# Patient Record
Sex: Female | Born: 1960 | Race: White | Hispanic: No | Marital: Married | Smoking: Never smoker
Health system: Southern US, Community
[De-identification: ages and names within clinical notes are randomized; demographics above are authoritative.]

## PROBLEM LIST (undated history)

## (undated) DIAGNOSIS — I8390 Asymptomatic varicose veins of unspecified lower extremity: Secondary | ICD-10-CM

## (undated) DIAGNOSIS — M722 Plantar fascial fibromatosis: Secondary | ICD-10-CM

## (undated) DIAGNOSIS — K449 Diaphragmatic hernia without obstruction or gangrene: Secondary | ICD-10-CM

## (undated) DIAGNOSIS — E785 Hyperlipidemia, unspecified: Secondary | ICD-10-CM

## (undated) DIAGNOSIS — G4700 Insomnia, unspecified: Secondary | ICD-10-CM

## (undated) DIAGNOSIS — I10 Essential (primary) hypertension: Secondary | ICD-10-CM

## (undated) DIAGNOSIS — K5790 Diverticulosis of intestine, part unspecified, without perforation or abscess without bleeding: Secondary | ICD-10-CM

## (undated) DIAGNOSIS — E079 Disorder of thyroid, unspecified: Secondary | ICD-10-CM

## (undated) DIAGNOSIS — K219 Gastro-esophageal reflux disease without esophagitis: Secondary | ICD-10-CM

## (undated) DIAGNOSIS — K37 Unspecified appendicitis: Secondary | ICD-10-CM

## (undated) DIAGNOSIS — T7840XA Allergy, unspecified, initial encounter: Secondary | ICD-10-CM

## (undated) HISTORY — DX: Diaphragmatic hernia without obstruction or gangrene: K44.9

## (undated) HISTORY — DX: Essential (primary) hypertension: I10

## (undated) HISTORY — DX: Disorder of thyroid, unspecified: E07.9

## (undated) HISTORY — PX: COLONOSCOPY: SHX174

## (undated) HISTORY — DX: Asymptomatic varicose veins of unspecified lower extremity: I83.90

## (undated) HISTORY — DX: Allergy, unspecified, initial encounter: T78.40XA

## (undated) HISTORY — DX: Hyperlipidemia, unspecified: E78.5

## (undated) HISTORY — DX: Unspecified appendicitis: K37

## (undated) HISTORY — PX: LASER ABLATION: SHX1947

## (undated) HISTORY — DX: Diverticulosis of intestine, part unspecified, without perforation or abscess without bleeding: K57.90

## (undated) HISTORY — DX: Plantar fascial fibromatosis: M72.2

## (undated) HISTORY — DX: Gastro-esophageal reflux disease without esophagitis: K21.9

---

## 1998-01-20 ENCOUNTER — Ambulatory Visit (HOSPITAL_COMMUNITY): Admission: RE | Admit: 1998-01-20 | Discharge: 1998-01-20 | Payer: Self-pay | Admitting: Obstetrics and Gynecology

## 1998-12-31 ENCOUNTER — Other Ambulatory Visit: Admission: RE | Admit: 1998-12-31 | Discharge: 1998-12-31 | Payer: Self-pay | Admitting: Obstetrics and Gynecology

## 2000-01-03 ENCOUNTER — Other Ambulatory Visit: Admission: RE | Admit: 2000-01-03 | Discharge: 2000-01-03 | Payer: Self-pay | Admitting: Obstetrics and Gynecology

## 2001-01-30 ENCOUNTER — Other Ambulatory Visit: Admission: RE | Admit: 2001-01-30 | Discharge: 2001-01-30 | Payer: Self-pay | Admitting: Obstetrics and Gynecology

## 2002-02-04 ENCOUNTER — Other Ambulatory Visit: Admission: RE | Admit: 2002-02-04 | Discharge: 2002-02-04 | Payer: Self-pay | Admitting: Obstetrics and Gynecology

## 2003-02-18 ENCOUNTER — Other Ambulatory Visit: Admission: RE | Admit: 2003-02-18 | Discharge: 2003-02-18 | Payer: Self-pay | Admitting: Obstetrics and Gynecology

## 2004-02-26 ENCOUNTER — Other Ambulatory Visit: Admission: RE | Admit: 2004-02-26 | Discharge: 2004-02-26 | Payer: Self-pay | Admitting: Obstetrics and Gynecology

## 2005-03-29 ENCOUNTER — Other Ambulatory Visit: Admission: RE | Admit: 2005-03-29 | Discharge: 2005-03-29 | Payer: Self-pay | Admitting: Obstetrics and Gynecology

## 2006-04-22 ENCOUNTER — Emergency Department (HOSPITAL_COMMUNITY): Admission: EM | Admit: 2006-04-22 | Discharge: 2006-04-22 | Payer: Self-pay | Admitting: Emergency Medicine

## 2009-01-07 ENCOUNTER — Ambulatory Visit: Payer: Self-pay | Admitting: Vascular Surgery

## 2009-05-20 ENCOUNTER — Ambulatory Visit: Payer: Self-pay | Admitting: Vascular Surgery

## 2009-07-08 ENCOUNTER — Ambulatory Visit: Payer: Self-pay | Admitting: Vascular Surgery

## 2009-07-17 ENCOUNTER — Ambulatory Visit: Payer: Self-pay | Admitting: Vascular Surgery

## 2010-05-19 ENCOUNTER — Ambulatory Visit
Admission: RE | Admit: 2010-05-19 | Discharge: 2010-05-19 | Payer: Self-pay | Source: Home / Self Care | Attending: Vascular Surgery | Admitting: Vascular Surgery

## 2010-05-19 ENCOUNTER — Ambulatory Visit: Admit: 2010-05-19 | Payer: Self-pay | Admitting: Vascular Surgery

## 2010-07-14 ENCOUNTER — Other Ambulatory Visit (INDEPENDENT_AMBULATORY_CARE_PROVIDER_SITE_OTHER): Payer: BC Managed Care – PPO | Admitting: Vascular Surgery

## 2010-07-14 DIAGNOSIS — I83893 Varicose veins of bilateral lower extremities with other complications: Secondary | ICD-10-CM

## 2010-07-15 NOTE — Assessment & Plan Note (Signed)
OFFICE VISIT  CYNTIA, STALEY DOB:  Sep 09, 1960                                       07/14/2010 ZOXWR#:[id number]  The patient presents today for treatment of her left leg venous hypertension.  She had ablation of her left greater saphenous vein from just below her knee to just above the saphenofemoral junction and stab phlebectomy of tributaries in her left medial calf.  She had no immediate complication and was discharged to home.  She will be seen again in 1 week for continued followup.    Larina Earthly, M.D. Electronically Signed  TFE/MEDQ  D:  07/14/2010  T:  07/15/2010  Job:  [id number]

## 2010-07-21 ENCOUNTER — Ambulatory Visit (INDEPENDENT_AMBULATORY_CARE_PROVIDER_SITE_OTHER): Payer: BC Managed Care – PPO | Admitting: Vascular Surgery

## 2010-07-21 ENCOUNTER — Encounter (INDEPENDENT_AMBULATORY_CARE_PROVIDER_SITE_OTHER): Payer: BC Managed Care – PPO

## 2010-07-21 DIAGNOSIS — Z48812 Encounter for surgical aftercare following surgery on the circulatory system: Secondary | ICD-10-CM

## 2010-07-21 DIAGNOSIS — I83893 Varicose veins of bilateral lower extremities with other complications: Secondary | ICD-10-CM

## 2010-07-22 NOTE — Assessment & Plan Note (Signed)
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  She did extremely well following the procedure with minimal discomfort.  She does have some mild bruising.  She underwent a repeat duplex today, and this shows closure of her great saphenous vein to just below her saphenofemoral junction and no evidence of DVT.  I am quite pleased with her result, as is the patient.  She will see [place] again on an as-needed basis.    Larina Earthly, M.D. Electronically Signed  TFE/MEDQ  D:  07/21/2010  T:  07/22/2010  Job:  [id number]  cc:   Caryn Bee L. Little, M.D.

## 2010-07-27 NOTE — Procedures (Unsigned)
DUPLEX DEEP VENOUS EXAM - LOWER EXTREMITY  INDICATION:  Status post EVLT.  HISTORY:  Edema:  No. Trauma/Surgery:  Status post EVLT on 07/14/10. Pain:  No. PE:  No. Previous DVT:  No. Anticoagulants:  No. Other:  DUPLEX EXAM:               CFV   SFV   PopV  PTV    GSV               R  L  R  L  R  L  R   L  R  L Thrombosis    o  o     o     o      o     + Spontaneous   +  +     +     +      +     o Phasic        +  +     +     +      +     o Augmentation  +  +     +     +      +     o Compressible  +  +     +     +      +     o Competent     +  +     +     +      +     +  Legend:  + - yes  o - no  p - partial  D - decreased  IMPRESSION: 1. No evidence of acute deep venous thrombosis within the left lower     extremity. 2. Successful left greater saphenous vein ablation from the     saphenofemoral junction to the distal insertion site.   _____________________________ Larina Earthly, M.D.  OD/MEDQ  D:  07/21/2010  T:  07/21/2010  Job:  [id number]

## 2010-09-21 NOTE — Assessment & Plan Note (Signed)
OFFICE VISIT   Anna Mills, Anna Mills  DOB:  December 31, 1960                                       05/19/2010  EAVWU#:[id number]   The patient presents today for evaluation of left leg venous  hypertension and pain.  She is well-known to me from a prior right leg  great saphenous vein laser ablation and stab phlebectomy on July 08, 2009.  She had had symptoms in her left leg at the same setting but  these were mild.  She has subsequently had worsening discomfort in her  left leg.  She has had complete resolution of all of prior pain in her  right leg and reports that she is walking and exercising more due to the  relief of her right leg.  Her left leg has now become more of a burden.  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  She does not have any history of DVT.   On physical exam, her right leg has no evidence of varicosities.  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   On duplex imaging, she does have reflux throughout her left great  saphenous vein with a dilatation up to a maximal diameter of 0.79 cm.  She does have a prolonged reflux in the great saphenous vein.  She does  not have any significant reflux in the deep system.  There is some  reflux in the common femoral vein only.  I discussed options with the  patient.  She has been wearing her thigh-high 20 to 30 mmHg graduated  compression stockings for greater than 3 months on the left leg and is  not having any appreciable relief of symptoms.  She is an ideal  candidate for ablation, and I would expect the same outstanding result  in the left leg is as she has achieved in her right leg.  We will  schedule this at her earliest convenience.     Larina Earthly, M.D.  Electronically Signed   TFE/MEDQ  D:  05/19/2010  T:  05/20/2010  Job:  [id number]

## 2010-09-21 NOTE — Assessment & Plan Note (Signed)
OFFICE VISIT   Anna Mills, Anna Mills  DOB:  06/11/1960                                       05/19/2009  ZOXWR#:[id number]   The patient presents today for continued evaluation of her right leg  venous varicosities.  She has worn graduated compression garments since  our last visit with her.  She reports that this does not give her any  improvement.  She does report that she continues to have pain with  prolonged standing most pronounced over her varicosities in her  popliteal fossa but also she reports an achy sensation further in her  calf as well.  She works as a Engineer, civil (consulting) and has leg pain making performing  her job duties difficult with prolonged standing.  She also reports that  exercising is more difficult due to leg pain and has had to decrease  frequency and duration of her aerobic exercise.   REVIEW OF SYSTEMS:  Otherwise unchanged.  No major medical difficulties.  Otherwise healthy.  Does not smoke or drink alcohol.   PHYSICAL EXAM:  Vital signs:  Blood pressure is 107/70, pulse 61,  respirations 16.  General:  She is in no acute distress, well-nourished.  Her dorsalis pedis pulses are 2+ bilaterally.  She does not have any  major deformities in her lower extremities.  No cyanosis.  She has no  focal neurologic deficits.  She has no ulceration or rashes.  She does  have marked saphenous tributary varicosities in her medial calf and  popliteal fossa.   I again reviewed her duplex with her from September of 2010.  This does  show reflux in her right great saphenous vein from her groin to her calf  with the varicosities coming off the refluxing vein.  I explained that  this is not putting her at any increased risk for DVT but she is having  pain associated with this.  I have suggested that we proceed with laser  ablation of her right great saphenous vein, stab phlebectomy of  tributary varicosities for relief of her symptoms.     Larina Earthly, M.D.  Electronically Signed   TFE/MEDQ  D:  05/20/2009  T:  05/21/2009  Job:  [id number]

## 2010-09-21 NOTE — Consult Note (Signed)
NEW PATIENT CONSULTATION   Anna Mills, Anna Mills  DOB:  1960/08/03                                       01/07/2009  ZOXWR#:[id number]   The patient presents today for evaluation of right leg varicosities and  pain.  She is a very active pleasant 50 year old white female with  progressive and severe discomfort in her right popliteal space related  to venous varicosities.  She reports this has been present for a number  of years but has been progressive.  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  She does not have any history  of DVT.   PAST MEDICAL HISTORY:  Significant for hypothyroidism.  She otherwise  has no history of cardiac disease or pulmonary dysfunction.   SOCIAL HISTORY:  She is married with three children.  She does not smoke  or drink alcohol.  Weight is 150 pounds.  She is 5 feet 7 inches tall.  She denies cardiac, pulmonary, GI or GU difficulty.  She does have sinus  headaches.   MEDICATION ALLERGIES:  Valtrex and codeine.   CURRENT MEDICATIONS:  Synthroid and lisinopril.   PHYSICAL EXAMINATION:  A well-developed, well-nourished white female  appearing stated age of 47.  Blood pressure is 109/76, pulse 65,  respirations 18.  Her radial and dorsalis pedis pulses are 2+  bilaterally.  She does not have any significant varicosities in her left  leg.  On her right leg she does have marked varicosities in her medial  popliteal space.   She underwent venous duplex in our office and this revealed reflux  throughout her right great saphenous vein with a connection between this  and the large varicosities in her popliteal space.  This does not appear  to involve her small saphenous vein.  She has mild deep venous reflux.   I discussed the significance of this with the patient.  She does elevate  her legs when possible and takes Advil for discomfort.   She is fitted  today for graduated compression stockings and we have instructed her on  the use of these.  I plan to see her again in 3 months to determine if  this is working for her.  I did discuss the option of laser ablation and  stab phlebectomy for relief of symptoms should she fail conservative  treatment.  We will see her again in 3 months for continued discussion.   Larina Earthly, M.D.  Electronically Signed   TFE/MEDQ  D:  01/07/2009  T:  01/07/2009  Job:  [id number]   cc:   Caryn Bee L. Little, M.D.

## 2010-09-21 NOTE — Procedures (Signed)
LOWER EXTREMITY VENOUS REFLUX EXAM   INDICATION:  Right lower extremity varicose veins in posterior calf.   EXAM:  Using color-flow imaging and pulse Doppler spectral analysis, the  right common femoral, superficial femoral, popliteal, posterior tibial,  greater and lesser saphenous veins are evaluated.  There is evidence  suggesting mild deep venous insufficiency in the right lower extremity.   The right saphenofemoral junction is mildly incompetent with reflux of  >500 milliseconds.  The right GSV is not competent  with reflux of >500  milliseconds, with the caliber as described below.   The right proximal short saphenous vein was not visualized.   GSV Diameter (used if found to be incompetent only)                                            Right    Left  Proximal Greater Saphenous Vein           0.55 cm  cm  Proximal-to-mid-thigh                     cm       cm  Mid thigh                                 0.43 cm  cm  Mid-distal thigh                          cm       cm  Distal thigh                              0.42 cm  cm  Knee                                      0.38 cm  cm   IMPRESSION:  1. Right greater saphenous vein reflux with >500 milliseconds is      identified with the caliber ranging from 0.38 cm to 0.55 cm knee to      groin.  2. The right greater saphenous vein is not aneurysmal.  3. The right greater saphenous vein is not tortuous.  4. The deep venous system is mildly incompetent with reflux of >500      milliseconds.  5. The right lesser saphenous vein was not visualized.         ___________________________________________  Larina Earthly, M.D.   AS/MEDQ  D:  01/07/2009  T:  01/07/2009  Job:  [id number]

## 2010-09-21 NOTE — Assessment & Plan Note (Signed)
OFFICE VISIT   AUGUST, GOSSER  DOB:  [birth year]                                       07/17/2009  ZOXWR#:[id number]   Patient presents today for evaluation of her right leg greater saphenous  vein laser ablation and stab phlebectomy and multiple tributary  varicosities.  The surgery was on 07/08/09.  She has done well since the  procedure.  She does have the usual amount of bruising.  She has some  thickening at the area of the subcuticular phlebectomy and minimal  discomfort with this.   She underwent repeat venous duplex today in our office, and this  confirms closure of her great saphenous vein and injury and no DVT in  her deep system.   I am quite pleased with her initial result as is the patient.  I plan to  see her again in 6 weeks for continued follow-up.     Larina Earthly, M.D.  Electronically Signed   TFE/MEDQ  D:  07/17/2009  T:  07/17/2009  Job:  [id number]   cc:   Caryn Bee L. Little, M.D.

## 2010-09-21 NOTE — Procedures (Signed)
LOWER EXTREMITY VENOUS REFLUX EXAM   INDICATION:  Varicose veins.   EXAM:  Using color-flow imaging and pulse Doppler spectral analysis, the  left common femoral, superficial femoral, popliteal, posterior tibial,  greater and lesser saphenous veins are evaluated.  There is evidence  suggesting deep venous insufficiency in the left common femoral vein.   The left saphenofemoral junction is not competent with reflux of  >539milliseconds. The left GSV is not competent with reflux of >500  milliseconds with the caliber as described below.   The left proximal short saphenous vein demonstrates competency.   GSV Diameter (used if found to be incompetent only)                                            Right    Left  Proximal Greater Saphenous Vein           cm       0.78 cm  Proximal-to-mid-thigh                     cm       0.79 cm  Mid thigh                                 cm       0.51 cm  Mid-distal thigh                          cm       cm  Distal thigh                              cm       0.75 cm  Knee                                      cm       cm   IMPRESSION:  1. Left greater saphenous vein is not competent with reflux      >571milliseconds.  2. The left greater saphenous vein is not tortuous.  3. The deep venous system is not competent with reflux of      >563milliseconds in the left common femoral vein.  4. The left short saphenous vein is not competent.   ___________________________________________  Larina Earthly, M.D.   OD/MEDQ  D:  05/19/2010  T:  05/19/2010  Job:  [id number]

## 2010-09-21 NOTE — Assessment & Plan Note (Signed)
OFFICE VISIT   AVELEEN, NEVERS  DOB:  Dec 30, 1960                                       07/08/2009  ZOXWR#:[id number]   The patient presents today for treatment of her right leg venous  hypertension.  She underwent right great saphenous vein laser ablation  from her knee to just below her saphenofemoral junction and also  multiple stab phlebectomies of painful tributary varicosities in her  calf.  This was done under tumescent local anesthesia.  She had no  immediate complications and was discharged without difficulty.   Plan to see her again in 1 week with venous duplex ultrasound follow-up.     Larina Earthly, M.D.  Electronically Signed   TFE/MEDQ  D:  07/08/2009  T:  07/09/2009  Job:  [id number]

## 2010-09-21 NOTE — Procedures (Signed)
DUPLEX DEEP VENOUS EXAM - LOWER EXTREMITY   INDICATION:  Status post right greater saphenous vein laser ablation.   HISTORY:  Edema:  No.  Trauma/Surgery:  Right greater saphenous vein laser ablation on  07/08/09.  Pain:  No.  PE:  No.  Previous DVT:  No.  Anticoagulants:  Other:   DUPLEX EXAM:                CFV   SFV   PopV  PTV    GSV                R  L  R  L  R  L  R   L  R  L  Thrombosis    o  o  o     o     o      +  Spontaneous   +  +  +     +     +      o  Phasic        +  +  +     +     +      o  Augmentation  +  +  +     +     +      o  Compressible  +  +  +     +     +      o  Competent     +  +  +     +     +      o   Legend:  + - yes  o - no  p - partial  D - decreased   IMPRESSION:  1. No evidence of deep venous thrombosis noted in the right lower      extremity.  2. A totally occluded right greater saphenous vein noted extending      from the distal insertion site to the groin area.  3. The right saphenofemoral junction is competent.     _____________________________  Larina Earthly, M.D.   CH/MEDQ  D:  07/20/2009  T:  07/20/2009  Job:  [id number]

## 2013-11-19 ENCOUNTER — Other Ambulatory Visit: Payer: Self-pay | Admitting: Obstetrics and Gynecology

## 2013-11-19 DIAGNOSIS — R928 Other abnormal and inconclusive findings on diagnostic imaging of breast: Secondary | ICD-10-CM

## 2013-12-06 ENCOUNTER — Ambulatory Visit
Admission: RE | Admit: 2013-12-06 | Discharge: 2013-12-06 | Disposition: A | Discharge status: Home / Self Care | Payer: BC Managed Care – PPO | Source: Ambulatory Visit | Attending: Obstetrics and Gynecology | Admitting: Obstetrics and Gynecology

## 2013-12-06 DIAGNOSIS — R928 Other abnormal and inconclusive findings on diagnostic imaging of breast: Secondary | ICD-10-CM

## 2013-12-12 ENCOUNTER — Other Ambulatory Visit: Payer: Self-pay | Admitting: Obstetrics and Gynecology

## 2013-12-12 DIAGNOSIS — R9230 Dense breasts, unspecified: Secondary | ICD-10-CM

## 2013-12-12 DIAGNOSIS — Z803 Family history of malignant neoplasm of breast: Secondary | ICD-10-CM

## 2013-12-12 DIAGNOSIS — R922 Inconclusive mammogram: Secondary | ICD-10-CM

## 2013-12-18 ENCOUNTER — Ambulatory Visit
Admission: RE | Admit: 2013-12-18 | Discharge: 2013-12-18 | Disposition: A | Discharge status: Home / Self Care | Payer: BC Managed Care – PPO | Source: Ambulatory Visit | Attending: Obstetrics and Gynecology | Admitting: Obstetrics and Gynecology

## 2013-12-18 DIAGNOSIS — Z803 Family history of malignant neoplasm of breast: Secondary | ICD-10-CM

## 2013-12-18 DIAGNOSIS — R922 Inconclusive mammogram: Secondary | ICD-10-CM

## 2013-12-18 MED ORDER — GADOBENATE DIMEGLUMINE 529 MG/ML IV SOLN
15.0000 mL | Freq: Once | INTRAVENOUS | Status: AC | PRN
  Administered 2013-12-18: 15 mL via INTRAVENOUS

## 2014-10-08 ENCOUNTER — Telehealth: Payer: Self-pay | Admitting: *Deleted

## 2014-10-08 NOTE — Telephone Encounter (Signed)
Left detailed telephone message returning Anna Mills's earlier call regarding "creepy,shaking" feeling in her legs in absence of varicose veins.    Anna Mills is s/p 07-08-2009 endovenous laser ablation right greater saphenous vein and stab phlebectomy (right leg) by Curt Jews MD and s/p 07-14-2010 endovenous laser ablation left greater saphenous vein and stab phlebectomy by Curt Jews MD.  Explained that leg heaviness, achiness, burning sensation are symptomatic of venous issues. Explained that the "creepy, shaking " feeling in her legs is not normally symptomatic of venous issues. If she has further questions or concerns about her leg symptoms, I asked that she call me back.  Left specific times when I would be available to talk tomorrow (10-09-2014) if needed.

## 2014-12-16 ENCOUNTER — Other Ambulatory Visit: Payer: Self-pay | Admitting: *Deleted

## 2014-12-16 DIAGNOSIS — I83893 Varicose veins of bilateral lower extremities with other complications: Secondary | ICD-10-CM

## 2014-12-16 DIAGNOSIS — R200 Anesthesia of skin: Secondary | ICD-10-CM

## 2015-01-09 ENCOUNTER — Encounter: Payer: Self-pay | Admitting: Vascular Surgery

## 2015-01-13 ENCOUNTER — Encounter: Payer: Self-pay | Admitting: Vascular Surgery

## 2015-01-13 ENCOUNTER — Ambulatory Visit (INDEPENDENT_AMBULATORY_CARE_PROVIDER_SITE_OTHER): Payer: 59 | Admitting: Vascular Surgery

## 2015-01-13 ENCOUNTER — Encounter (HOSPITAL_COMMUNITY): Payer: Self-pay

## 2015-01-13 ENCOUNTER — Ambulatory Visit: Payer: Self-pay | Admitting: Vascular Surgery

## 2015-01-13 ENCOUNTER — Ambulatory Visit (HOSPITAL_COMMUNITY)
Admission: RE | Admit: 2015-01-13 | Discharge: 2015-01-13 | Disposition: A | Discharge status: Home / Self Care | Payer: 59 | Source: Ambulatory Visit | Attending: Vascular Surgery | Admitting: Vascular Surgery

## 2015-01-13 VITALS — BP 135/65 | HR 66 | Temp 98.2°F | Resp 16 | Ht 67.5 in | Wt 159.0 lb

## 2015-01-13 DIAGNOSIS — R200 Anesthesia of skin: Secondary | ICD-10-CM

## 2015-01-13 DIAGNOSIS — R208 Other disturbances of skin sensation: Secondary | ICD-10-CM

## 2015-01-13 NOTE — Progress Notes (Signed)
Patient name: Anna Mills MRN: [id number] DOB: 22-Jan-1961 Sex: female    Reason for referral:  Chief Complaint  Patient presents with  . Varicose Veins    numbness and tingling both legs and coldness left leg    HISTORY OF PRESENT ILLNESS: Patient presents today for evaluation of lower extremity symptoms. She is well-known to me from prior staged bilateral great saphenous vein ablation and stab phlebectomy of multiple large Tributary varicosities in her right leg. This was in 2011 and 2012. She has done well since that time. 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 She reports that she has had noted some slowly progressive sensation of numbness from her knees distally. This is worse in the left but can occur on the right as well. This does seem to be associated with prolonged standing. She has no history of DVT. She has no recurrent varicosities and no history of arterial insufficiency. She does have some mild chronic back discomfort but no diagnosis of degenerative disc disease. She does wear compression garments intermittently.  Past Medical History  Diagnosis Date  . Varicose veins   . Hypertension   . Thyroid disease     Past Surgical History  Procedure Laterality Date  . Laser ablation Bilateral     Social History   Social History  . Marital Status: Single    Spouse Name: N/A  . Number of Children: N/A  . Years of Education: N/A   Occupational History  . Not on file.   Social History Main Topics  . Smoking status: Never Smoker   . Smokeless tobacco: Never Used  . Alcohol Use: No  . Drug Use: No  . Sexual Activity: Not on file   Other Topics Concern  . Not on file   Social History Narrative  . No narrative on file    Family History  Problem Relation Age of Onset  . Cancer Mother     breast  . Peripheral vascular disease Father     Allergies as of 01/13/2015 - Review Complete 01/13/2015  Allergen Reaction Noted    . Valcyte [valganciclovir hcl] Swelling 01/13/2015    No current outpatient prescriptions on file prior to visit.   No current facility-administered medications on file prior to visit.     REVIEW OF SYSTEMS:  Positives indicated with an "X"  CARDIOVASCULAR:  [ ]  chest pain   [ ]  chest pressure   [ ]  palpitations   [ ]  orthopnea   [ ]  dyspnea on exertion   [ ]  claudication   [ ]  rest pain   [ ]  DVT   [ ]  phlebitis PULMONARY:   [ ]  productive cough   [ ]  asthma   [ ]  wheezing NEUROLOGIC:   [ ]  weakness  [ ]  paresthesias  [ ]  aphasia  [ ]  amaurosis  [ ]  dizziness HEMATOLOGIC:   [ ]  bleeding problems   [ ]  clotting disorders MUSCULOSKELETAL:  [ ]  joint pain   [ ]  joint swelling GASTROINTESTINAL: [ ]   blood in stool  [ ]   hematemesis GENITOURINARY:  [ ]   dysuria  [ ]   hematuria PSYCHIATRIC:  [ ]  history of major depression INTEGUMENTARY:  [ ]  rashes  [ ]  ulcers CONSTITUTIONAL:  [ ]  fever   [ ]  chills  PHYSICAL EXAMINATION:  General: The patient is a well-nourished female, in no acute distress. Vital signs are BP 135/65 mmHg  Pulse 66  Temp(Src) 98.2 F (36.8 C)  Resp 16  Ht 5' 7.5" (1.715 m)  Wt 159 lb (72.122 kg)  BMI 24.52 kg/m2  SpO2 100% Pulmonary: There is a good air exchange  Musculoskeletal: There are no major deformities.  There is no significant extremity pain. Neurologic: No focal weakness or paresthesias are detected, Skin: There are no ulcer or rashes noted. Psychiatric: The patient has normal affect. Cardiovascular: Palpable dorsalis pedis pulses bilaterally Few scattered telangiectasia bilaterally. No varicosities and no chronic edema changes of venous hypertension   VVS Vascular Lab Studies:  Ordered and Independently Reviewed lower extremity rate venous reflux evaluation showed successful closure of her great saphenous vein bilaterally. She has no incompetence of her small saphenous vein bilaterally. She does have some reflux in her popliteal veins  bilaterally  Impression and Plan:  Had long discussion with patient. I do not see any evidence of correctable venous pathology to explain her symptoms. Explained that the numbness usually is not a component of venous hypertension. I do not see any reason for her to limit activity. Would wear compression if this makes her legs feel better but not if it does not make her legs feel better. She does have mild deep venous reflux with reflux in the popliteal veins. Discussed this with. This does not prevent her from her usual activities of work and exercise. She was reassured with this discussion will see [place] again on as-needed basis    [facility] Office: [phone]

## 2016-01-06 ENCOUNTER — Encounter: Payer: Self-pay | Admitting: Podiatry

## 2016-01-06 ENCOUNTER — Ambulatory Visit (INDEPENDENT_AMBULATORY_CARE_PROVIDER_SITE_OTHER): Payer: 59 | Admitting: Podiatry

## 2016-01-06 VITALS — BP 117/67 | HR 56

## 2016-01-06 DIAGNOSIS — M722 Plantar fascial fibromatosis: Secondary | ICD-10-CM

## 2016-01-06 NOTE — Patient Instructions (Signed)
Continue wearing your existing orthotics in athletic lace up style shoes To further support your arch use prewrap apply to midfoot and then cover with 2 inch sports tape, apply daily as needed    Plantar Fasciitis Plantar fasciitis is a painful foot condition that affects the heel. It occurs when the band of tissue that connects the toes to the heel bone (plantar fascia) becomes irritated. This can happen after exercising too much or doing other repetitive activities (overuse injury). The pain from plantar fasciitis can range from mild irritation to severe pain that makes it difficult for you to walk or move. The pain is usually worse in the morning or after you have been sitting or lying down for a while. CAUSES This condition may be caused by:  Standing for long periods of time.  Wearing shoes that do not fit.  Doing high-impact activities, including running, aerobics, and ballet.  Being overweight.  Having an abnormal way of walking (gait).  Having tight calf muscles.  Having high arches in your feet.  Starting a new athletic activity. SYMPTOMS The main symptom of this condition is heel pain. Other symptoms include:  Pain that gets worse after activity or exercise.  Pain that is worse in the morning or after resting.  Pain that goes away after you walk for a few minutes. DIAGNOSIS This condition may be diagnosed based on your signs and symptoms. Your health care provider will also do a physical exam to check for:  A tender area on the bottom of your foot.  A high arch in your foot.  Pain when you move your foot.  Difficulty moving your foot. You may also need to have imaging studies to confirm the diagnosis. These can include:  X-rays.  Ultrasound.  MRI. TREATMENT  Treatment for plantar fasciitis depends on the severity of the condition. Your treatment may include:  Rest, ice, and over-the-counter pain medicines to manage your pain.  Exercises to stretch your  calves and your plantar fascia.  A splint that holds your foot in a stretched, upward position while you sleep (night splint).  Physical therapy to relieve symptoms and prevent problems in the future.  Cortisone injections to relieve severe pain.  Extracorporeal shock wave therapy (ESWT) to stimulate damaged plantar fascia with electrical impulses. It is often used as a last resort before surgery.  Surgery, if other treatments have not worked after 12 months. HOME CARE INSTRUCTIONS  Take medicines only as directed by your health care provider.  Avoid activities that cause pain.  Roll the bottom of your foot over a bag of ice or a bottle of cold water. Do this for 20 minutes, 3-4 times a day.  Perform simple stretches as directed by your health care provider.  Try wearing athletic shoes with air-sole or gel-sole cushions or soft shoe inserts.  Wear a night splint while sleeping, if directed by your health care provider.  Keep all follow-up appointments with your health care provider. PREVENTION   Do not perform exercises or activities that cause heel pain.  Consider finding low-impact activities if you continue to have problems.  Lose weight if you need to. The best way to prevent plantar fasciitis is to avoid the activities that aggravate your plantar fascia. SEEK MEDICAL CARE IF:  Your symptoms do not go away after treatment with home care measures.  Your pain gets worse.  Your pain affects your ability to move or do your daily activities.   This information is not intended  to replace advice given to you by your health care provider. Make sure you discuss any questions you have with your health care provider.   Document Released: 01/18/2001 Document Revised: 01/14/2015 Document Reviewed: 03/05/2014 Elsevier Interactive Patient Education [employer].

## 2016-01-06 NOTE — Progress Notes (Signed)
   Subjective:    Patient ID: Anna Mills, female    DOB: 11/08/1960, 55 y.o.   MRN: [id number]  HPI    This patient presents today complaining of ongoing plantar right heel pain noticeable on FirstStep in the morning when she changed from seated to standing position. The heel pain increases with standing and walking. Over 8+ year. Patient has worn custom podiatric rigid orthotics which controls the discomfort. She currently has 2 pairs of existing rigid orthotics that are still tape used to use on a daily basis and wears them inside starting lace up athletic style shoes. Patient wants to know if she needs replacement orthotics. 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      Objective:   Physical Exam  Orientated 3  Vascular: No calf edema or calf tenderness bilaterally DP and PT pulses 2/4 bilaterally Capillary reflex immediate bilaterally  Neurological: Sensation to 10 g monofilament wire intact 5/5 bilaterally Vibratory sensation reactive bilaterally Ankle reflex equal and reactive bilaterally  Dermatological: Texture and turgor within normal limits bilaterally  Musculoskeletal: Upon weight-bearing patient has a vertical heel There is no restriction ankle, subtalar, midtarsal joints bilaterally Patient has current rigid orthotic with extrinsic rear foot post and intrinsic forefoot post that is a good state of repair and contour satisfactorily There is mild palpable tenderness in medial proximal fascial band on the right which duplicates patient areas of discomfort      Assessment & Plan:   Assessment: Chronic plantar fasciitis right Satisfactory fit of current orthotic  Plan: At this time I i patient that if her existing orthotics were adequate and I did not think she needed  replacement orthotics. I encouraged her to continue wearing orthotics in athletic style shoe. Also, I demonstrated the application of 2 inch sports tape over prewrap on her midfoot right when she returns to work to give for additional support. We also discussed shoeing and stretching  Reappoint at patient's request

## 2016-08-10 DIAGNOSIS — Z0000 Encounter for general adult medical examination without abnormal findings: Secondary | ICD-10-CM | POA: Diagnosis not present

## 2016-08-10 DIAGNOSIS — E039 Hypothyroidism, unspecified: Secondary | ICD-10-CM | POA: Diagnosis not present

## 2016-08-10 DIAGNOSIS — I10 Essential (primary) hypertension: Secondary | ICD-10-CM | POA: Diagnosis not present

## 2016-12-22 DIAGNOSIS — Z01419 Encounter for gynecological examination (general) (routine) without abnormal findings: Secondary | ICD-10-CM | POA: Diagnosis not present

## 2017-02-28 DIAGNOSIS — L570 Actinic keratosis: Secondary | ICD-10-CM | POA: Diagnosis not present

## 2017-02-28 DIAGNOSIS — D1800 Hemangioma unspecified site: Secondary | ICD-10-CM | POA: Diagnosis not present

## 2017-02-28 DIAGNOSIS — D2271 Melanocytic nevi of right lower limb, including hip: Secondary | ICD-10-CM | POA: Diagnosis not present

## 2017-04-19 DIAGNOSIS — G4721 Circadian rhythm sleep disorder, delayed sleep phase type: Secondary | ICD-10-CM | POA: Diagnosis not present

## 2017-04-24 ENCOUNTER — Ambulatory Visit: Payer: 59 | Admitting: Podiatry

## 2017-04-27 DIAGNOSIS — G4721 Circadian rhythm sleep disorder, delayed sleep phase type: Secondary | ICD-10-CM | POA: Diagnosis not present

## 2017-05-16 DIAGNOSIS — L719 Rosacea, unspecified: Secondary | ICD-10-CM | POA: Diagnosis not present

## 2017-05-16 DIAGNOSIS — L821 Other seborrheic keratosis: Secondary | ICD-10-CM | POA: Diagnosis not present

## 2017-05-16 DIAGNOSIS — Z872 Personal history of diseases of the skin and subcutaneous tissue: Secondary | ICD-10-CM | POA: Diagnosis not present

## 2017-08-15 DIAGNOSIS — E039 Hypothyroidism, unspecified: Secondary | ICD-10-CM | POA: Diagnosis not present

## 2017-08-16 DIAGNOSIS — Z1211 Encounter for screening for malignant neoplasm of colon: Secondary | ICD-10-CM | POA: Diagnosis not present

## 2017-08-16 DIAGNOSIS — Z0000 Encounter for general adult medical examination without abnormal findings: Secondary | ICD-10-CM | POA: Diagnosis not present

## 2017-08-16 DIAGNOSIS — G4700 Insomnia, unspecified: Secondary | ICD-10-CM | POA: Diagnosis not present

## 2017-10-03 ENCOUNTER — Ambulatory Visit (INDEPENDENT_AMBULATORY_CARE_PROVIDER_SITE_OTHER): Payer: 59

## 2017-10-03 ENCOUNTER — Other Ambulatory Visit: Payer: Self-pay

## 2017-10-03 ENCOUNTER — Encounter: Payer: Self-pay | Admitting: Podiatry

## 2017-10-03 ENCOUNTER — Ambulatory Visit (INDEPENDENT_AMBULATORY_CARE_PROVIDER_SITE_OTHER): Payer: 59 | Admitting: Podiatry

## 2017-10-03 VITALS — BP 139/76 | HR 65

## 2017-10-03 DIAGNOSIS — M7661 Achilles tendinitis, right leg: Secondary | ICD-10-CM

## 2017-10-03 MED ORDER — METHYLPREDNISOLONE 4 MG PO TBPK: ORAL_TABLET | ORAL | 0 refills | Status: DC

## 2017-10-03 NOTE — Progress Notes (Signed)
  Subjective:  Patient ID: Anna Mills, female    DOB: 04-Aug-1960,  MRN: [id number] HPI Chief Complaint  Patient presents with  . Foot Pain    right foot back of heel; pt stated, "been hurting for about 52mo, changed regular shoes but didnt work; have little burning and pain right now (4/10) but about 2wks ago i stopped wearing my Klogs to work and it has gotten a little better"    57 y.o. female presents with the above complaint.   ROS: Denies fever chills nausea vomiting muscle aches pains calf pain back pain chest pain shortness of breath.  Past Medical History:  Diagnosis Date  . Hypertension   . Thyroid disease   . Varicose veins    Past Surgical History:  Procedure Laterality Date  . LASER ABLATION Bilateral     Current Outpatient Medications:  .  levothyroxine (SYNTHROID, LEVOTHROID) 112 MCG tablet, Take 112 mcg by mouth daily before breakfast., Disp: , Rfl:  .  lisinopril (PRINIVIL,ZESTRIL) 10 MG tablet, , Disp: , Rfl:  .  zolpidem (AMBIEN) 10 MG tablet, , Disp: , Rfl:  .  methylPREDNISolone (MEDROL DOSEPAK) 4 MG TBPK tablet, 6 day dose pack - take as directed, Disp: 21 tablet, Rfl: 0  Allergies  Allergen Reactions  . Valcyte [Valganciclovir Hcl] Swelling   Review of Systems Objective:   Vitals:   10/03/17 1500  BP: 139/76  Pulse: 65    General: Well developed, nourished, in no acute distress, alert and oriented x3   Dermatological: Skin is warm, dry and supple bilateral. Nails x 10 are well maintained; remaining integument appears unremarkable at this time. There are no open sores, no preulcerative lesions, no rash or signs of infection present.  Vascular: Dorsalis Pedis artery and Posterior Tibial artery pedal pulses are 2/4 bilateral with immedate capillary fill time. Pedal hair growth present. No varicosities and no lower extremity edema present bilateral.   Neruologic: Grossly intact via light touch bilateral. Vibratory intact via tuning fork bilateral.  Protective threshold with Semmes Wienstein monofilament intact to all pedal sites bilateral. Patellar and Achilles deep tendon reflexes 2+ bilateral. No Babinski or clonus noted bilateral.   Musculoskeletal: No gross boney pedal deformities bilateral. No pain, crepitus, or limitation noted with foot and ankle range of motion bilateral. Muscular strength 5/5 in all groups tested bilateral.  Mild tenderness on palpation of the distal aspect of the Achilles.  There is no warmth or fluid noted significant amount of pain.  Gait: Unassisted, Nonantalgic.    Radiographs:  Radiographs taken today demonstrate thickening of the Achilles tendon on the posterior aspect of the calcaneus.  Small retrocalcaneal heel spur and Haglund's deformity.    Assessment & Plan:   Assessment: Distal Achilles tendinitis Haglund's deformity right resolving slowly.    Plan: Placed her in a night splint and put on a steroid Dosepak.  Discussed appropriate shoe gear stretching exercise ice therapy as your modifications.     Anna Mills T. [place], [place]

## 2017-10-03 NOTE — Patient Instructions (Signed)

## 2017-10-04 ENCOUNTER — Telehealth: Payer: Self-pay | Admitting: Podiatry

## 2017-10-04 NOTE — Telephone Encounter (Signed)
I saw Dr. Milinda Pointer yesterday and he wanted me to take a low dose of prednisone for six days. I just went to the drug store and they gave me 21 tablets of 4 mg of methylprednisolone and it says take as directed. I need to know what the directions are. The best number to reach me at and where you can leave a message is [phone]. Thank you.

## 2017-10-04 NOTE — Telephone Encounter (Signed)
I informed pt the instructions would either be on the front or back of the pill card, beneath the pills. Pt states she had opened the bag, but not the card so she didn't see it until just now.

## 2017-10-17 ENCOUNTER — Ambulatory Visit (INDEPENDENT_AMBULATORY_CARE_PROVIDER_SITE_OTHER): Payer: 59 | Admitting: Orthotics

## 2017-10-17 DIAGNOSIS — M722 Plantar fascial fibromatosis: Secondary | ICD-10-CM

## 2017-10-17 DIAGNOSIS — M7661 Achilles tendinitis, right leg: Secondary | ICD-10-CM

## 2017-10-17 NOTE — Progress Notes (Signed)
Patient came in today to discuss which type of shoe best helps to address achilles tendonitis.

## 2017-10-24 DIAGNOSIS — M79676 Pain in unspecified toe(s): Secondary | ICD-10-CM

## 2017-10-31 ENCOUNTER — Ambulatory Visit (INDEPENDENT_AMBULATORY_CARE_PROVIDER_SITE_OTHER): Payer: 59 | Admitting: Podiatry

## 2017-10-31 ENCOUNTER — Encounter: Payer: Self-pay | Admitting: Podiatry

## 2017-10-31 DIAGNOSIS — M722 Plantar fascial fibromatosis: Secondary | ICD-10-CM

## 2017-10-31 DIAGNOSIS — M7661 Achilles tendinitis, right leg: Secondary | ICD-10-CM

## 2017-11-01 NOTE — Progress Notes (Signed)
She presents today for follow-up of her Achilles tendinitis right plantar fasciitis left states that there is still tingling in burning.  Objective: Vital signs are stable alert and oriented x3 pulses are palpable.  She has pain on palpation of the posterior aspect of the right heel considerable nodularity but there is fluctuance overlying the nodule consistent with bursitis and Achilles tendinitis.  She also has pain on palpation medial calcaneal tubercle of the left heel.  Assessment: Achilles tendinitis bursitis right plantar fasciitis left.  Plan: Discussed etiology pathology conservative surgical therapies at this point time injected dexamethasone into the bursa today 2 mg after sterile Betadine skin prep subcutaneously making sure not to inject into the tendon itself also injected the left heel today with 20 mg Kenalog 5 mg Marcaine point maximal tenderness of the left heel.  Tolerated procedure well without complications.  I will follow-up with her in 1 month she will be scheduled with Liliane Channel for orthotics.

## 2017-11-02 ENCOUNTER — Other Ambulatory Visit: Payer: Self-pay

## 2017-11-02 ENCOUNTER — Ambulatory Visit (AMBULATORY_SURGERY_CENTER): Payer: Self-pay | Admitting: *Deleted

## 2017-11-02 VITALS — Ht 67.5 in | Wt 165.5 lb

## 2017-11-02 DIAGNOSIS — Z1211 Encounter for screening for malignant neoplasm of colon: Secondary | ICD-10-CM

## 2017-11-02 MED ORDER — NA SULFATE-K SULFATE-MG SULF 17.5-3.13-1.6 GM/177ML PO SOLN: ORAL | 0 refills | Status: DC

## 2017-11-02 NOTE — Progress Notes (Signed)
No egg or soy allergy  No trouble moving neck  No home oxygen used or hx of sleep apnea  Refused emmi  No diet medications taken

## 2017-11-06 ENCOUNTER — Encounter: Payer: Self-pay | Admitting: Gastroenterology

## 2017-11-06 ENCOUNTER — Ambulatory Visit (INDEPENDENT_AMBULATORY_CARE_PROVIDER_SITE_OTHER): Payer: 59 | Admitting: Orthotics

## 2017-11-06 DIAGNOSIS — M722 Plantar fascial fibromatosis: Secondary | ICD-10-CM | POA: Diagnosis not present

## 2017-11-06 DIAGNOSIS — M7661 Achilles tendinitis, right leg: Secondary | ICD-10-CM

## 2017-11-06 NOTE — Progress Notes (Signed)
Patient came into today to be cast for Custom Foot Orthotics. Upon recommendation of Dr. Milinda Pointer Patient presents with plantar fas and achilles tend  Plan vendor [employer]

## 2017-11-07 NOTE — Addendum Note (Signed)
Addended by: Velora Heckler on: 11/07/2017 08:29 AM   Modules accepted: Level of Service

## 2017-11-16 ENCOUNTER — Telehealth: Payer: Self-pay

## 2017-11-20 ENCOUNTER — Ambulatory Visit (AMBULATORY_SURGERY_CENTER): Payer: 59 | Admitting: Gastroenterology

## 2017-11-20 ENCOUNTER — Encounter: Payer: Self-pay | Admitting: Gastroenterology

## 2017-11-20 VITALS — BP 117/72 | HR 55 | Temp 99.5°F | Resp 13 | Ht 67.5 in | Wt 165.0 lb

## 2017-11-20 DIAGNOSIS — D123 Benign neoplasm of transverse colon: Secondary | ICD-10-CM

## 2017-11-20 DIAGNOSIS — Z1211 Encounter for screening for malignant neoplasm of colon: Secondary | ICD-10-CM

## 2017-11-20 DIAGNOSIS — K635 Polyp of colon: Secondary | ICD-10-CM | POA: Diagnosis not present

## 2017-11-20 MED ORDER — SODIUM CHLORIDE 0.9 % IV SOLN: 500.0000 mL | Freq: Once | INTRAVENOUS | Status: DC

## 2017-11-20 NOTE — Progress Notes (Signed)
Called to room to assist during endoscopic procedure.  Patient ID and intended procedure confirmed with present staff. Received instructions for my participation in the procedure from the performing physician.  

## 2017-11-20 NOTE — Progress Notes (Signed)
Pt's states no medical or surgical changes since previsit or office visit. 

## 2017-11-20 NOTE — Patient Instructions (Signed)
*  handout on polyp given*  YOU HAD AN ENDOSCOPIC PROCEDURE TODAY AT [place]:   Refer to the procedure report that was given to you for any specific questions about what was found during the examination.  If the procedure report does not answer your questions, please call your gastroenterologist to clarify.  If you requested that your care partner not be given the details of your procedure findings, then the procedure report has been included in a sealed envelope for you to review at your convenience later.  YOU SHOULD EXPECT: Some feelings of bloating in the abdomen. Passage of more gas than usual.  Walking can help get rid of the air that was put into your GI tract during the procedure and reduce the bloating. If you had a lower endoscopy (such as a colonoscopy or flexible sigmoidoscopy) you may notice spotting of blood in your stool or on the toilet paper. If you underwent a bowel prep for your procedure, you may not have a normal bowel movement for a few days.  Please Note:  You might notice some irritation and congestion in your nose or some drainage.  This is from the oxygen used during your procedure.  There is no need for concern and it should clear up in a day or so.  SYMPTOMS TO REPORT IMMEDIATELY:   Following lower endoscopy (colonoscopy or flexible sigmoidoscopy):  Excessive amounts of blood in the stool  Significant tenderness or worsening of abdominal pains  Swelling of the abdomen that is new, acute  Fever of 100F or higher   For urgent or emergent issues, a gastroenterologist can be reached at any hour by calling [phone].   DIET:  We do recommend a small meal at first, but then you may proceed to your regular diet.  Drink plenty of fluids but you should avoid alcoholic beverages for 24 hours.  ACTIVITY:  You should plan to take it easy for the rest of today and you should NOT DRIVE or use heavy machinery until tomorrow (because of the sedation  medicines used during the test).    FOLLOW UP: Our staff will call the number listed on your records the next business day following your procedure to check on you and address any questions or concerns that you may have regarding the information given to you following your procedure. If we do not reach you, we will leave a message.  However, if you are feeling well and you are not experiencing any problems, there is no need to return our call.  We will assume that you have returned to your regular daily activities without incident.  If any biopsies were taken you will be contacted by phone or by letter within the next 1-3 weeks.  Please call us at [phone] if you have not heard about the biopsies in 3 weeks.    SIGNATURES/CONFIDENTIALITY: You and/or your care partner have signed paperwork which will be entered into your electronic medical record.  These signatures attest to the fact that that the information above on your After Visit Summary has been reviewed and is understood.  Full responsibility of the confidentiality of this discharge information lies with you and/or your care-partner.

## 2017-11-20 NOTE — Progress Notes (Signed)
Report given to PACU, vss 

## 2017-11-20 NOTE — Op Note (Signed)
[place] Patient Name: Anna Mills Procedure Date: 11/20/2017 1:15 PM MRN: [id number] Endoscopist: Milus Banister , MD Age: 57 Referring MD:  Date of Birth: [birth year] Gender: Female Account #: [id number] Procedure:                Colonoscopy Indications:              Screening for colorectal malignant neoplasm Medicines:                Monitored Anesthesia Care Procedure:                Pre-Anesthesia Assessment:                           - Prior to the procedure, a History and Physical                            was performed, and patient medications and                            allergies were reviewed. The patient's tolerance of                            previous anesthesia was also reviewed. The risks                            and benefits of the procedure and the sedation                            options and risks were discussed with the patient.                            All questions were answered, and informed consent                            was obtained. Prior Anticoagulants: The patient has                            taken no previous anticoagulant or antiplatelet                            agents. ASA Grade Assessment: II - A patient with                            mild systemic disease. After reviewing the risks                            and benefits, the patient was deemed in                            satisfactory condition to undergo the procedure.                           After obtaining informed consent, the colonoscope  was passed under direct vision. Throughout the                            procedure, the patient's blood pressure, pulse, and                            oxygen saturations were monitored continuously. The                            Model PCF-H190DL [id number]) scope was introduced                            through the anus and advanced to the the cecum,                            identified by  appendiceal orifice and ileocecal                            valve. The colonoscopy was performed without                            difficulty. The patient tolerated the procedure                            well. The quality of the bowel preparation was                            excellent. The ileocecal valve, appendiceal                            orifice, and rectum were photographed. Scope In: 1:25:44 PM Scope Out: 1:38:42 PM Scope Withdrawal Time: 0 hours 9 minutes 7 seconds  Total Procedure Duration: 0 hours 12 minutes 58 seconds  Findings:                 A 7 mm polyp was found in the transverse colon. The                            polyp was sessile. The polyp was removed with a                            cold snare. Resection and retrieval were complete.                           The exam was otherwise without abnormality on                            direct and retroflexion views. Complications:            No immediate complications. Estimated blood loss:                            None. Estimated Blood Loss:     Estimated blood loss: none. Impression:               -  One 7 mm polyp in the transverse colon, removed                            with a cold snare. Resected and retrieved.                           - The examination was otherwise normal on direct                            and retroflexion views. Recommendation:           - Patient has a contact number available for                            emergencies. The signs and symptoms of potential                            delayed complications were discussed with the                            patient. Return to normal activities tomorrow.                            Written discharge instructions were provided to the                            patient.                           - Resume previous diet.                           - Continue present medications.                           You will receive a letter within 2-3  weeks with the                            pathology results and my final recommendations.                           If the polyp(s) is proven to be 'pre-cancerous' on                            pathology, you will need repeat colonoscopy in 5                            years. If the polyp(s) is NOT 'precancerous' on                            pathology then you should repeat colon cancer                            screening in 10 years with colonoscopy without need  for colon cancer screening by any method prior to                            then (including stool testing). Milus Banister, MD 11/20/2017 1:40:55 PM This report has been signed electronically.

## 2017-11-21 ENCOUNTER — Telehealth: Payer: Self-pay

## 2017-11-21 NOTE — Telephone Encounter (Signed)
  Follow up Call-  Call back number 11/20/2017  Post procedure Call Back phone  # [phone]  Permission to leave phone message Yes  Some recent data might be hidden     Patient questions:  Do you have a fever, pain , or abdominal swelling? No. Pain Score  0 *  Have you tolerated food without any problems? Yes.    Have you been able to return to your normal activities? Yes.    Do you have any questions about your discharge instructions: Diet   No. Medications  No. Follow up visit  No.  Do you have questions or concerns about your Care? No.  Actions: * If pain score is 4 or above: No action needed, pain <4.

## 2017-11-28 ENCOUNTER — Ambulatory Visit (INDEPENDENT_AMBULATORY_CARE_PROVIDER_SITE_OTHER): Payer: 59 | Admitting: Orthotics

## 2017-11-28 DIAGNOSIS — M722 Plantar fascial fibromatosis: Secondary | ICD-10-CM

## 2017-11-28 NOTE — Progress Notes (Signed)
Patient came in today to pick up custom made foot orthotics.  The goals were accomplished and the patient reported no dissatisfaction with said orthotics.  Patient was advised of breakin period and how to report any issues. 

## 2018-01-03 DIAGNOSIS — Z01419 Encounter for gynecological examination (general) (routine) without abnormal findings: Secondary | ICD-10-CM | POA: Diagnosis not present

## 2018-01-11 ENCOUNTER — Ambulatory Visit (INDEPENDENT_AMBULATORY_CARE_PROVIDER_SITE_OTHER): Payer: 59 | Admitting: Podiatry

## 2018-01-11 ENCOUNTER — Encounter: Payer: Self-pay | Admitting: Podiatry

## 2018-01-11 ENCOUNTER — Telehealth: Payer: Self-pay | Admitting: *Deleted

## 2018-01-11 DIAGNOSIS — M7661 Achilles tendinitis, right leg: Secondary | ICD-10-CM

## 2018-01-11 DIAGNOSIS — M722 Plantar fascial fibromatosis: Secondary | ICD-10-CM | POA: Diagnosis not present

## 2018-01-11 NOTE — Telephone Encounter (Signed)
Hand carried orders to [facility].

## 2018-01-11 NOTE — Telephone Encounter (Signed)
-----   Message from Rip Harbour, [facility] sent at 01/11/2018  8:51 AM EDT ----- Regarding: PT PT referral to [facility] (in house)   Evaluate and treat - plantar fasciitis left and achilles tendonitis right   Duration: 3 x week x 4 weeks

## 2018-01-12 ENCOUNTER — Telehealth: Payer: Self-pay | Admitting: Podiatry

## 2018-01-12 DIAGNOSIS — M722 Plantar fascial fibromatosis: Secondary | ICD-10-CM

## 2018-01-12 NOTE — Addendum Note (Signed)
Addended by: Harriett Sine D on: 01/12/2018 09:59 AM   Modules accepted: Orders

## 2018-01-12 NOTE — Telephone Encounter (Signed)
Pt was referred to [facility] for PT but the patient has a PT that is closer to her that she would like to go to. Please give her a call.  [place]

## 2018-01-12 NOTE — Telephone Encounter (Signed)
Faxed required form, and demographics to [facility].

## 2018-01-13 NOTE — Progress Notes (Signed)
She presents today for follow-up of her Achilles tendinosis of her right foot and plantar fasciitis left foot.  Left foot is still giving me a lot of pain the right one seems to be much better.  Also been wearing orthotics and they are okay I guess not sure what is supposed to be happening with him.  Objective: Vital signs are stable alert and oriented x3 pulses are palpable.  She has pain on palpation medial calcaneal tubercle of her left heel.  Assessment: Resolving Achilles tendinitis right left plan a fasciitis.  Plan: Reinjected the left heel again today after sterile Betadine skin prep 20 mg Kenalog 5 mg Marcaine tolerated procedure well without complications follow-up with her 1 month if necessary.

## 2018-01-18 ENCOUNTER — Ambulatory Visit: Payer: 59 | Admitting: Podiatry

## 2018-01-31 DIAGNOSIS — M722 Plantar fascial fibromatosis: Secondary | ICD-10-CM | POA: Diagnosis not present

## 2018-02-08 DIAGNOSIS — M722 Plantar fascial fibromatosis: Secondary | ICD-10-CM | POA: Diagnosis not present

## 2018-02-19 DIAGNOSIS — M722 Plantar fascial fibromatosis: Secondary | ICD-10-CM | POA: Diagnosis not present

## 2018-02-27 DIAGNOSIS — L814 Other melanin hyperpigmentation: Secondary | ICD-10-CM | POA: Diagnosis not present

## 2018-02-27 DIAGNOSIS — L821 Other seborrheic keratosis: Secondary | ICD-10-CM | POA: Diagnosis not present

## 2018-02-27 DIAGNOSIS — D2271 Melanocytic nevi of right lower limb, including hip: Secondary | ICD-10-CM | POA: Diagnosis not present

## 2018-03-07 DIAGNOSIS — M722 Plantar fascial fibromatosis: Secondary | ICD-10-CM | POA: Diagnosis not present

## 2018-04-10 DIAGNOSIS — M722 Plantar fascial fibromatosis: Secondary | ICD-10-CM | POA: Diagnosis not present

## 2018-04-25 DIAGNOSIS — M722 Plantar fascial fibromatosis: Secondary | ICD-10-CM | POA: Diagnosis not present

## 2018-06-14 DIAGNOSIS — M7732 Calcaneal spur, left foot: Secondary | ICD-10-CM | POA: Diagnosis not present

## 2018-06-14 DIAGNOSIS — M722 Plantar fascial fibromatosis: Secondary | ICD-10-CM | POA: Diagnosis not present

## 2018-08-23 DIAGNOSIS — E039 Hypothyroidism, unspecified: Secondary | ICD-10-CM | POA: Diagnosis not present

## 2018-08-23 DIAGNOSIS — G4700 Insomnia, unspecified: Secondary | ICD-10-CM | POA: Diagnosis not present

## 2018-08-23 DIAGNOSIS — I10 Essential (primary) hypertension: Secondary | ICD-10-CM | POA: Diagnosis not present

## 2020-10-15 ENCOUNTER — Other Ambulatory Visit: Payer: 59

## 2020-11-30 ENCOUNTER — Other Ambulatory Visit: Payer: 59

## 2020-12-20 ENCOUNTER — Emergency Department (HOSPITAL_COMMUNITY): Payer: 59

## 2020-12-20 ENCOUNTER — Encounter (HOSPITAL_COMMUNITY): Payer: Self-pay | Admitting: Emergency Medicine

## 2020-12-20 ENCOUNTER — Observation Stay (HOSPITAL_COMMUNITY)
Admission: EM | Admit: 2020-12-20 | Discharge: 2020-12-22 | Disposition: A | Discharge status: Home / Self Care | Payer: 59 | Attending: Emergency Medicine | Admitting: Emergency Medicine

## 2020-12-20 ENCOUNTER — Other Ambulatory Visit: Payer: Self-pay

## 2020-12-20 DIAGNOSIS — I10 Essential (primary) hypertension: Secondary | ICD-10-CM | POA: Diagnosis not present

## 2020-12-20 DIAGNOSIS — Z79899 Other long term (current) drug therapy: Secondary | ICD-10-CM | POA: Diagnosis not present

## 2020-12-20 DIAGNOSIS — K3580 Unspecified acute appendicitis: Secondary | ICD-10-CM | POA: Diagnosis present

## 2020-12-20 DIAGNOSIS — R109 Unspecified abdominal pain: Secondary | ICD-10-CM | POA: Diagnosis present

## 2020-12-20 DIAGNOSIS — K353 Acute appendicitis with localized peritonitis, without perforation or gangrene: Secondary | ICD-10-CM | POA: Diagnosis not present

## 2020-12-20 DIAGNOSIS — Z20822 Contact with and (suspected) exposure to covid-19: Secondary | ICD-10-CM | POA: Diagnosis not present

## 2020-12-20 HISTORY — DX: Insomnia, unspecified: G47.00

## 2020-12-20 LAB — COMPREHENSIVE METABOLIC PANEL
ALT: 32 U/L (ref 0–44)
AST: 33 U/L (ref 15–41)
Albumin: 4.4 g/dL (ref 3.5–5.0)
Alkaline Phosphatase: 67 U/L (ref 38–126)
Anion gap: 8 (ref 5–15)
BUN: 15 mg/dL (ref 6–20)
CO2: 24 mmol/L (ref 22–32)
Calcium: 9.3 mg/dL (ref 8.9–10.3)
Chloride: 104 mmol/L (ref 98–111)
Creatinine, Ser: 0.71 mg/dL (ref 0.44–1.00)
GFR, Estimated: >60 mL/min (ref >60)
Glucose, Bld: 112 mg/dL — ABNORMAL HIGH (ref 70–99)
Potassium: 4.4 mmol/L (ref 3.5–5.1)
Sodium: 136 mmol/L (ref 135–145)
Total Bilirubin: 0.9 mg/dL (ref 0.3–1.2)
Total Protein: 7.4 g/dL (ref 6.5–8.1)

## 2020-12-20 LAB — CBC
HCT: 40.8 % (ref 36.0–46.0)
Hemoglobin: 14.3 g/dL (ref 12.0–15.0)
MCH: 31.6 pg (ref 26.0–34.0)
MCHC: 35 g/dL (ref 30.0–36.0)
MCV: 90.3 fL (ref 80.0–100.0)
Platelets: 346 10*3/uL (ref 150–400)
RBC: 4.52 MIL/uL (ref 3.87–5.11)
RDW: 12.7 % (ref 11.5–15.5)
WBC: 15.2 10*3/uL — ABNORMAL HIGH (ref 4.0–10.5)
nRBC: 0 % (ref 0.0–0.2)

## 2020-12-20 LAB — URINALYSIS, ROUTINE W REFLEX MICROSCOPIC
Bilirubin Urine: NEGATIVE
Glucose, UA: NEGATIVE mg/dL
Hgb urine dipstick: NEGATIVE
Ketones, ur: 15 mg/dL — AB
Leukocytes,Ua: NEGATIVE
Nitrite: NEGATIVE
Specific Gravity, Urine: >1.03 — ABNORMAL HIGH (ref 1.005–1.030)
pH: 6 (ref 5.0–8.0)

## 2020-12-20 LAB — URINALYSIS, MICROSCOPIC (REFLEX)

## 2020-12-20 LAB — RESP PANEL BY RT-PCR (FLU A&B, COVID) ARPGX2
Influenza A by PCR: NEGATIVE
Influenza B by PCR: NEGATIVE
SARS Coronavirus 2 by RT PCR: NEGATIVE

## 2020-12-20 LAB — LIPASE, BLOOD: Lipase: 65 U/L — ABNORMAL HIGH (ref 11–51)

## 2020-12-20 MED ORDER — PIPERACILLIN-TAZOBACTAM 3.375 G IVPB 30 MIN
3.3750 g | Freq: Once | INTRAVENOUS | Status: AC
  Administered 2020-12-20: 3.375 g via INTRAVENOUS
  Filled 2020-12-20: qty 50

## 2020-12-20 MED ORDER — DIPHENHYDRAMINE HCL 25 MG PO CAPS: 25.0000 mg | ORAL_CAPSULE | Freq: Four times a day (QID) | ORAL | Status: DC | PRN

## 2020-12-20 MED ORDER — LISINOPRIL 5 MG PO TABS: 5.0000 mg | ORAL_TABLET | Freq: Every day | ORAL | Status: DC

## 2020-12-20 MED ORDER — SODIUM CHLORIDE 0.9 % IV SOLN: INTRAVENOUS | Status: DC

## 2020-12-20 MED ORDER — MORPHINE SULFATE (PF) 2 MG/ML IV SOLN: 2.0000 mg | INTRAVENOUS | Status: DC | PRN

## 2020-12-20 MED ORDER — LEVOTHYROXINE SODIUM 112 MCG PO TABS
112.0000 ug | ORAL_TABLET | Freq: Every day | ORAL | Status: DC
  Administered 2020-12-21: 112 ug via ORAL
  Filled 2020-12-20: qty 1

## 2020-12-20 MED ORDER — ZOLPIDEM TARTRATE 5 MG PO TABS
5.0000 mg | ORAL_TABLET | Freq: Every evening | ORAL | Status: DC | PRN
  Administered 2020-12-21 (×2): 5 mg via ORAL
  Filled 2020-12-20 (×2): qty 1

## 2020-12-20 MED ORDER — DIPHENHYDRAMINE HCL 50 MG/ML IJ SOLN: 25.0000 mg | Freq: Four times a day (QID) | INTRAMUSCULAR | Status: DC | PRN

## 2020-12-20 MED ORDER — ACETAMINOPHEN 325 MG PO TABS
650.0000 mg | ORAL_TABLET | Freq: Four times a day (QID) | ORAL | Status: DC | PRN
  Administered 2020-12-21 (×2): 650 mg via ORAL
  Filled 2020-12-20 (×2): qty 2

## 2020-12-20 MED ORDER — ONDANSETRON HCL 4 MG/2ML IJ SOLN: 4.0000 mg | Freq: Four times a day (QID) | INTRAMUSCULAR | Status: DC | PRN

## 2020-12-20 MED ORDER — LEVOTHYROXINE SODIUM 112 MCG PO TABS: 112.0000 ug | ORAL_TABLET | Freq: Every day | ORAL | Status: DC

## 2020-12-20 MED ORDER — ENOXAPARIN SODIUM 40 MG/0.4ML IJ SOSY: 40.0000 mg | PREFILLED_SYRINGE | INTRAMUSCULAR | Status: DC

## 2020-12-20 MED ORDER — ONDANSETRON 4 MG PO TBDP
4.0000 mg | ORAL_TABLET | Freq: Four times a day (QID) | ORAL | Status: DC | PRN
Start: 2020-12-20 — End: 2020-12-21

## 2020-12-20 MED ORDER — IOHEXOL 350 MG/ML SOLN
80.0000 mL | Freq: Once | INTRAVENOUS | Status: AC | PRN
  Administered 2020-12-20: 80 mL via INTRAVENOUS

## 2020-12-20 NOTE — ED Triage Notes (Signed)
Pt reports intermittent sharp periumbilical pain since last night. Took morning vitamins this morning and vomited. Pt able to tolerate some PO intake (small sips) at this time. Last BM today.

## 2020-12-20 NOTE — ED Provider Notes (Signed)
Emergency Medicine Provider Triage Evaluation Note  Anna Mills , a 60 y.o. female  was evaluated in triage.  Pt complains of abdominal pain.  Patient states her pain is periumbilical and began this morning upon waking.  She took her morning vitamins and began experiencing nausea and had an episode of vomiting.  States her vomit was clear and included her morning medications.  States her pain has been persistent today and reports associated decreased appetite.  She has been able to tolerate small amounts of fluids but otherwise has not had any p.o. intake today.  Denies a surgical history to her abdomen.  Denies any urinary complaints.  Physical Exam  BP 139/78   Pulse 86   SpO2 100%  Gen:   Awake, no distress   Resp:  Normal effort  MSK:   Moves extremities without difficulty  Other:  Abdomen is flat and soft.  Mild to moderate tenderness noted in the periumbilical region.  Medical Decision Making  Medically screening exam initiated at 4:23 PM.  Appropriate orders placed.  TORYN GUNDER was informed that the remainder of the evaluation will be completed by another provider, this initial triage assessment does not replace that evaluation, and the importance of remaining in the ED until their evaluation is complete.   Rayna Sexton, PA-C 12/20/20 1625    Luna Fuse, MD 12/20/20 2026

## 2020-12-20 NOTE — H&P (Signed)
Anna Mills is an 60 y.o. female.   Chief Complaint: RLQ pain HPI: This is a 60 year old female who presents with acute onset of RLQ abdominal pain beginning at 3 AM, associated with nausea and vomiting.  No prior abdominal surgeries.  He presented to the ED for evaluation and was found to have uncomplicated appendicitis.   Past Medical History:  Diagnosis Date   Hypertension    Insomnia    Plantar fasciitis    Thyroid disease    Varicose veins     Past Surgical History:  Procedure Laterality Date   LASER ABLATION Bilateral     Family History  Problem Relation Age of Onset   Cancer Mother        breast   Peripheral vascular disease Father    Colon cancer Neg Hx    Esophageal cancer Neg Hx    Stomach cancer Neg Hx    Rectal cancer Neg Hx    Social History:  reports that she has never smoked. She has never used smokeless tobacco. She reports that she does not drink alcohol and does not use drugs.  Allergies:  Allergies  Allergen Reactions   Valcyte [Valganciclovir Hcl] Swelling   Prior to Admission medications   Medication Sig Start Date End Date Taking? Authorizing Provider  levothyroxine (SYNTHROID, LEVOTHROID) 112 MCG tablet Take 112 mcg by mouth daily before breakfast.    [provider]  lisinopril (PRINIVIL,ZESTRIL) 10 MG tablet  09/22/17   [provider]  zolpidem (AMBIEN) 10 MG tablet  09/05/17   [provider]      Results for orders placed or performed during the hospital encounter of 12/20/20 (from the past 48 hour(s))  Lipase, blood     Status: Abnormal   Collection Time: 12/20/20  4:30 PM  Result Value Ref Range   Lipase 65 (H) 11 - 51 U/L    Comment: Performed at [facility], [place] [address] [place], [place] [address]  Comprehensive metabolic panel     Status: Abnormal   Collection Time: 12/20/20  4:30 PM  Result Value Ref Range   Sodium 136 135 - 145 mmol/L   Potassium 4.4 3.5 - 5.1 mmol/L   Chloride  104 98 - 111 mmol/L   CO2 24 22 - 32 mmol/L   Glucose, Bld 112 (H) 70 - 99 mg/dL    Comment: Glucose reference range applies only to samples taken after fasting for at least 8 hours.   BUN 15 6 - 20 mg/dL   Creatinine, Ser 0.71 0.44 - 1.00 mg/dL   Calcium 9.3 8.9 - 10.3 mg/dL   Total Protein 7.4 6.5 - 8.1 g/dL   Albumin 4.4 3.5 - 5.0 g/dL   AST 33 15 - 41 U/L   ALT 32 0 - 44 U/L   Alkaline Phosphatase 67 38 - 126 U/L   Total Bilirubin 0.9 0.3 - 1.2 mg/dL   GFR, Estimated >60 >60 mL/min    Comment: (NOTE) Calculated using the CKD-EPI Creatinine Equation (2021)    Anion gap 8 5 - 15    Comment: Performed at [facility], [place] [address] [place], [place] [address]  CBC     Status: Abnormal   Collection Time: 12/20/20  4:30 PM  Result Value Ref Range   WBC 15.2 (H) 4.0 - 10.5 K/uL   RBC 4.52 3.87 - 5.11 MIL/uL   Hemoglobin 14.3 12.0 - 15.0 g/dL   HCT 40.8 36.0 -  46.0 %   MCV 90.3 80.0 - 100.0 fL   MCH 31.6 26.0 - 34.0 pg   MCHC 35.0 30.0 - 36.0 g/dL   RDW 12.7 11.5 - 15.5 %   Platelets 346 150 - 400 K/uL   nRBC 0.0 0.0 - 0.2 %    Comment: Performed at [facility], [place] [address], [place], [place] [address]  Urinalysis, Routine w reflex microscopic     Status: Abnormal   Collection Time: 12/20/20  4:30 PM  Result Value Ref Range   Color, Urine YELLOW YELLOW   APPearance CLEAR CLEAR   Specific Gravity, Urine >1.030 (H) 1.005 - 1.030   pH 6.0 5.0 - 8.0   Glucose, UA NEGATIVE NEGATIVE mg/dL   Hgb urine dipstick NEGATIVE NEGATIVE   Bilirubin Urine NEGATIVE NEGATIVE   Ketones, ur 15 (A) NEGATIVE mg/dL   Protein, ur TRACE (A) NEGATIVE mg/dL   Nitrite NEGATIVE NEGATIVE   Leukocytes,Ua NEGATIVE NEGATIVE    Comment: Performed at [facility] [address] [place], [place] [address]  Urinalysis, Microscopic (reflex)     Status: Abnormal   Collection Time: 12/20/20  4:30 PM  Result Value Ref Range   RBC / HPF 0-5 0 - 5  RBC/hpf   WBC, UA 0-5 0 - 5 WBC/hpf   Bacteria, UA RARE (A) NONE SEEN   Squamous Epithelial / LPF 0-5 0 - 5   Mucus PRESENT     Comment: Performed at [facility], [place] [address] [place], [place] [address]  Resp Panel by RT-PCR (Flu A&B, Covid) Nasopharyngeal Swab     Status: None   Collection Time: 12/20/20  8:02 PM   Specimen: Nasopharyngeal Swab; Nasopharyngeal(NP) swabs in vial transport medium  Result Value Ref Range   SARS Coronavirus 2 by RT PCR NEGATIVE NEGATIVE    Comment: (NOTE) SARS-CoV-2 target nucleic acids are NOT DETECTED.  The SARS-CoV-2 RNA is generally detectable in upper respiratory specimens during the acute phase of infection. The lowest concentration of SARS-CoV-2 viral copies this assay can detect is 138 copies/mL. A negative result does not preclude SARS-Cov-2 infection and should not be used as the sole basis for treatment or other patient management decisions. A negative result may occur with  improper specimen collection/handling, submission of specimen other than nasopharyngeal swab, presence of viral mutation(s) within the areas targeted by this assay, and inadequate number of viral copies(<138 copies/mL). A negative result must be combined with clinical observations, patient history, and epidemiological information. The expected result is Negative.  Fact Sheet for Patients:  EntrepreneurPulse.com.au  Fact Sheet for Healthcare Providers:  IncredibleEmployment.be  This test is no t yet approved or cleared by the [place] FDA and  has been authorized for detection and/or diagnosis of SARS-CoV-2 by FDA under an Emergency Use Authorization (EUA). This EUA will remain  in effect (meaning this test can be used) for the duration of the COVID-19 declaration under Section 564(b)(1) of the Act, 21 U.S.C.section 360bbb-3(b)(1), unless the authorization is terminated  or revoked sooner.        Influenza A by PCR NEGATIVE NEGATIVE   Influenza B by PCR NEGATIVE NEGATIVE    Comment: (NOTE) The Xpert Xpress SARS-CoV-2/FLU/RSV plus assay is intended as an aid in the diagnosis of influenza from Nasopharyngeal swab specimens and should not be used as a sole basis for treatment. Nasal washings and aspirates are unacceptable for Xpert Xpress SARS-CoV-2/FLU/RSV testing.  Fact Sheet for Patients: EntrepreneurPulse.com.au  Fact  Sheet for Healthcare Providers: IncredibleEmployment.be  This test is not yet approved or cleared by the [place] and has been authorized for detection and/or diagnosis of SARS-CoV-2 by FDA under an Emergency Use Authorization (EUA). This EUA will remain in effect (meaning this test can be used) for the duration of the COVID-19 declaration under Section 564(b)(1) of the Act, 21 U.S.C. section 360bbb-3(b)(1), unless the authorization is terminated or revoked.  Performed at [facility], [place] [address] [place], [place] [address]    CT Abdomen Pelvis W Contrast  Result Date: 12/20/2020 CLINICAL DATA:  Mid abdominal pain. EXAM: CT ABDOMEN AND PELVIS WITH CONTRAST TECHNIQUE: Multidetector CT imaging of the abdomen and pelvis was performed using the standard protocol following bolus administration of intravenous contrast. CONTRAST:  53m OMNIPAQUE IOHEXOL 350 MG/ML SOLN COMPARISON:  None. FINDINGS: Lower chest: No acute abnormality. Hepatobiliary: No focal liver abnormality is seen. No gallstones, gallbladder wall thickening, or biliary dilatation. Pancreas: Unremarkable. No pancreatic ductal dilatation or surrounding inflammatory changes. Spleen: Normal in size without focal abnormality. Adrenals/Urinary Tract: Adrenal glands are unremarkable. Kidneys are normal, without renal calculi, focal lesion, or hydronephrosis. Bladder is unremarkable. Stomach/Bowel: There is a small hiatal hernia. The appendix is  markedly inflamed. Associated periappendiceal inflammatory fat stranding is seen without evidence of associated perforation or abscess. No evidence of bowel dilatation. Noninflamed diverticula are seen throughout the sigmoid colon Vascular/Lymphatic: No significant vascular findings are present. No enlarged abdominal or pelvic lymph nodes. Reproductive: Uterus and bilateral adnexa are unremarkable. Other: No abdominal wall hernia or abnormality. No abdominopelvic ascites. Musculoskeletal: No acute or significant osseous findings. IMPRESSION: 1. Marked severity appendicitis without associated perforation or abscess. 2. Sigmoid diverticulosis. 3. Small hiatal hernia. Electronically Signed   By: TVirgina NorfolkM.D.   On: 12/20/2020 20:00    Review of Systems  HENT:  Negative for ear discharge, ear pain, hearing loss and tinnitus.   Eyes:  Negative for photophobia and pain.  Respiratory:  Negative for cough and shortness of breath.   Cardiovascular:  Negative for chest pain.  Gastrointestinal:  Positive for abdominal pain, nausea and vomiting.  Genitourinary:  Negative for dysuria, flank pain, frequency and urgency.  Musculoskeletal:  Negative for back pain, myalgias and neck pain.  Neurological:  Negative for dizziness and headaches.  Hematological:  Does not bruise/bleed easily.  Psychiatric/Behavioral:  The patient is not nervous/anxious.    Blood pressure 140/88, pulse 70, temperature 97.7 F (36.5 C), temperature source Oral, resp. rate 18, SpO2 100 %. Physical Exam  Constitutional:  WDWN in NAD, conversant, no obvious deformities; lying in bed comfortably Eyes:  Pupils equal, round; sclera anicteric; moist conjunctiva; no lid lag HENT:  Oral mucosa moist; good dentition  Neck:  No masses palpated, trachea midline; no thyromegaly Lungs:  CTA bilaterally; normal respiratory effort CV:  Regular rate and rhythm; no murmurs; extremities well-perfused with no edema Abd:  +bowel sounds, soft,  tender in RLQ no palpable organomegaly; no palpable hernias Musc:  Unable to assess gait; no apparent clubbing or cyanosis in extremities Lymphatic:  No palpable cervical or axillary lymphadenopathy Skin:  Warm, dry; no sign of jaundice Psychiatric - alert and oriented x 4; calm mood and affect  Assessment/Plan Acute appendicitis - no sign of perforation or abscess Admit for IV antibiotics Surgery in AM by Dr. GJohney Maine- laparoscopic appendectomy.  MMaia Petties MD 12/20/2020, 9:41 PM

## 2020-12-20 NOTE — ED Provider Notes (Signed)
[facility] DEPT Provider Note   CSN: [id number] Arrival date & time: 12/20/20  1558     History Chief Complaint  Patient presents with   Abdominal Pain   Emesis    Anna Mills is a 60 y.o. female.  Patient presents chief complaint of right-sided abdominal pain.  She describes it as sharp and persistent.  Started around 3 AM in the morning today and continued throughout the day and she finally presented to the ER.  She denies fevers at home.  Denies vomiting or diarrhea.  No prior abdominal surgeries in the past.      Past Medical History:  Diagnosis Date   Hypertension    Insomnia    Plantar fasciitis    Thyroid disease    Varicose veins     There are no problems to display for this patient.   Past Surgical History:  Procedure Laterality Date   LASER ABLATION Bilateral      OB History   No obstetric history on file.     Family History  Problem Relation Age of Onset   Cancer Mother        breast   Peripheral vascular disease Father    Colon cancer Neg Hx    Esophageal cancer Neg Hx    Stomach cancer Neg Hx    Rectal cancer Neg Hx     Social History   Tobacco Use   Smoking status: Never   Smokeless tobacco: Never  Vaping Use   Vaping Use: Never used  Substance Use Topics   Alcohol use: No    Alcohol/week: 0.0 standard drinks   Drug use: No    Home Medications Prior to Admission medications   Medication Sig Start Date End Date Taking? Authorizing Provider  levothyroxine (SYNTHROID, LEVOTHROID) 112 MCG tablet Take 112 mcg by mouth daily before breakfast.    [provider]  lisinopril (PRINIVIL,ZESTRIL) 10 MG tablet  09/22/17   [provider]  zolpidem (AMBIEN) 10 MG tablet  09/05/17   [provider]    Allergies    Valcyte [valganciclovir hcl]  Review of Systems   Review of Systems  Constitutional:  Negative for fever.  HENT:  Negative for ear pain.   Eyes:  Negative for pain.   Respiratory:  Negative for cough.   Cardiovascular:  Negative for chest pain.  Gastrointestinal:  Positive for abdominal pain.  Genitourinary:  Negative for flank pain.  Musculoskeletal:  Negative for back pain.  Skin:  Negative for rash.  Neurological:  Negative for headaches.   Physical Exam Updated Vital Signs BP 135/83   Pulse 71   Temp 97.7 F (36.5 C) (Oral)   Resp 16   SpO2 100%   Physical Exam Constitutional:      General: She is not in acute distress.    Appearance: Normal appearance.  HENT:     Head: Normocephalic.     Nose: Nose normal.  Eyes:     Extraocular Movements: Extraocular movements intact.  Cardiovascular:     Rate and Rhythm: Normal rate.  Pulmonary:     Effort: Pulmonary effort is normal.  Abdominal:     Tenderness: There is abdominal tenderness in the right lower quadrant.  Musculoskeletal:        General: Normal range of motion.     Cervical back: Normal range of motion.  Neurological:     General: No focal deficit present.     Mental Status: She is alert.  Mental status is at baseline.    ED Results / Procedures / Treatments   Labs (all labs ordered are listed, but only abnormal results are displayed) Labs Reviewed  LIPASE, BLOOD - Abnormal; Notable for the following components:      Result Value   Lipase 65 (*)    All other components within normal limits  COMPREHENSIVE METABOLIC PANEL - Abnormal; Notable for the following components:   Glucose, Bld 112 (*)    All other components within normal limits  CBC - Abnormal; Notable for the following components:   WBC 15.2 (*)    All other components within normal limits  URINALYSIS, ROUTINE W REFLEX MICROSCOPIC - Abnormal; Notable for the following components:   Specific Gravity, Urine >1.030 (*)    Ketones, ur 15 (*)    Protein, ur TRACE (*)    All other components within normal limits  URINALYSIS, MICROSCOPIC (REFLEX) - Abnormal; Notable for the following components:   Bacteria, UA  RARE (*)    All other components within normal limits  RESP PANEL BY RT-PCR (FLU A&B, COVID) [id number]    EKG None  Radiology CT Abdomen Pelvis W Contrast  Result Date: 12/20/2020 CLINICAL DATA:  Mid abdominal pain. EXAM: CT ABDOMEN AND PELVIS WITH CONTRAST TECHNIQUE: Multidetector CT imaging of the abdomen and pelvis was performed using the standard protocol following bolus administration of intravenous contrast. CONTRAST:  42m OMNIPAQUE IOHEXOL 350 MG/ML SOLN COMPARISON:  None. FINDINGS: Lower chest: No acute abnormality. Hepatobiliary: No focal liver abnormality is seen. No gallstones, gallbladder wall thickening, or biliary dilatation. Pancreas: Unremarkable. No pancreatic ductal dilatation or surrounding inflammatory changes. Spleen: Normal in size without focal abnormality. Adrenals/Urinary Tract: Adrenal glands are unremarkable. Kidneys are normal, without renal calculi, focal lesion, or hydronephrosis. Bladder is unremarkable. Stomach/Bowel: There is a small hiatal hernia. The appendix is markedly inflamed. Associated periappendiceal inflammatory fat stranding is seen without evidence of associated perforation or abscess. No evidence of bowel dilatation. Noninflamed diverticula are seen throughout the sigmoid colon Vascular/Lymphatic: No significant vascular findings are present. No enlarged abdominal or pelvic lymph nodes. Reproductive: Uterus and bilateral adnexa are unremarkable. Other: No abdominal wall hernia or abnormality. No abdominopelvic ascites. Musculoskeletal: No acute or significant osseous findings. IMPRESSION: 1. Marked severity appendicitis without associated perforation or abscess. 2. Sigmoid diverticulosis. 3. Small hiatal hernia. Electronically Signed   By: TVirgina NorfolkM.D.   On: 12/20/2020 20:00    Procedures Procedures   Medications Ordered in ED Medications  piperacillin-tazobactam (ZOSYN) IVPB 3.375 g (has no administration in time range)  iohexol (OMNIPAQUE)  350 MG/ML injection 80 mL (80 mLs Intravenous Contrast Given 12/20/20 1937)    ED Course  I have reviewed the triage vital signs and the nursing notes.  Pertinent labs & imaging results that were available during my care of the patient were reviewed by me and considered in my medical decision making (see chart for details).    MDM Rules/Calculators/A&P                           Patient has elevated white count.  Chemistry otherwise unremarkable.  CT abdomen pelvis concerning for marked severity appendicitis without evidence of perforation or abscess.  Patient started on IV Zosyn, consultation with surgery who plans for operative intervention in the morning.  Final Clinical Impression(s) / ED Diagnoses Final diagnoses:  Acute appendicitis, unspecified acute appendicitis type    Rx / DC Orders ED Discharge Orders  None        Luna Fuse, MD 12/20/20 2028

## 2020-12-20 NOTE — ED Notes (Signed)
Pt to CT via stretcher

## 2020-12-20 NOTE — ED Notes (Signed)
Anna Newcomer, NP notified that pt needs Ambien for sleep that she takes at home and also is requesting tylenol. Pt prefers to not take Morphine for pain

## 2020-12-21 ENCOUNTER — Observation Stay (HOSPITAL_COMMUNITY): Payer: 59 | Admitting: Certified Registered"

## 2020-12-21 ENCOUNTER — Encounter (HOSPITAL_COMMUNITY)
Admission: EM | Disposition: A | Discharge status: Home / Self Care | Payer: Self-pay | Source: Home / Self Care | Attending: Emergency Medicine

## 2020-12-21 ENCOUNTER — Encounter (HOSPITAL_COMMUNITY): Payer: Self-pay

## 2020-12-21 HISTORY — PX: LAPAROSCOPIC APPENDECTOMY: SHX408

## 2020-12-21 LAB — SURGICAL PCR SCREEN
MRSA, PCR: NEGATIVE
Staphylococcus aureus: NEGATIVE

## 2020-12-21 LAB — HIV ANTIBODY (ROUTINE TESTING W REFLEX): HIV Screen 4th Generation wRfx: NONREACTIVE

## 2020-12-21 SURGERY — APPENDECTOMY, LAPAROSCOPIC
Anesthesia: General | Site: Abdomen

## 2020-12-21 MED ORDER — METOPROLOL TARTRATE 5 MG/5ML IV SOLN: 5.0000 mg | Freq: Four times a day (QID) | INTRAVENOUS | Status: DC | PRN

## 2020-12-21 MED ORDER — HYDROMORPHONE HCL 1 MG/ML IJ SOLN: 0.5000 mg | INTRAMUSCULAR | Status: DC | PRN

## 2020-12-21 MED ORDER — LIDOCAINE 2% (20 MG/ML) 5 ML SYRINGE
INTRAMUSCULAR | Status: AC
  Filled 2020-12-21: qty 5

## 2020-12-21 MED ORDER — ROCURONIUM BROMIDE 10 MG/ML (PF) SYRINGE
PREFILLED_SYRINGE | INTRAVENOUS | Status: AC
  Filled 2020-12-21: qty 10

## 2020-12-21 MED ORDER — FENTANYL CITRATE (PF) 100 MCG/2ML IJ SOLN
INTRAMUSCULAR | Status: AC
  Filled 2020-12-21: qty 2

## 2020-12-21 MED ORDER — SODIUM CHLORIDE 0.9% FLUSH: 3.0000 mL | INTRAVENOUS | Status: DC | PRN

## 2020-12-21 MED ORDER — GABAPENTIN 300 MG PO CAPS
300.0000 mg | ORAL_CAPSULE | ORAL | Status: AC
  Administered 2020-12-21: 300 mg via ORAL
  Filled 2020-12-21: qty 1

## 2020-12-21 MED ORDER — SUCCINYLCHOLINE CHLORIDE 200 MG/10ML IV SOSY
PREFILLED_SYRINGE | INTRAVENOUS | Status: DC | PRN
  Administered 2020-12-21: 100 mg via INTRAVENOUS

## 2020-12-21 MED ORDER — ONDANSETRON HCL 4 MG/2ML IJ SOLN
INTRAMUSCULAR | Status: AC
  Filled 2020-12-21: qty 2

## 2020-12-21 MED ORDER — SCOPOLAMINE 1 MG/3DAYS TD PT72: 1.0000 | MEDICATED_PATCH | TRANSDERMAL | Status: DC

## 2020-12-21 MED ORDER — HYDROMORPHONE HCL 1 MG/ML IJ SOLN
INTRAMUSCULAR | Status: AC
  Filled 2020-12-21: qty 1

## 2020-12-21 MED ORDER — BUPIVACAINE-EPINEPHRINE 0.25% -1:200000 IJ SOLN
INTRAMUSCULAR | Status: DC | PRN
  Administered 2020-12-21: 30 mL

## 2020-12-21 MED ORDER — LACTATED RINGERS IR SOLN
Status: DC | PRN
  Administered 2020-12-21: 1000 mL

## 2020-12-21 MED ORDER — LIP MEDEX EX OINT
1.0000 "application " | TOPICAL_OINTMENT | Freq: Two times a day (BID) | CUTANEOUS | Status: DC
  Administered 2020-12-21 – 2020-12-22 (×2): 1 via TOPICAL
  Filled 2020-12-21 (×2): qty 7

## 2020-12-21 MED ORDER — SODIUM CHLORIDE 0.9% FLUSH
3.0000 mL | Freq: Two times a day (BID) | INTRAVENOUS | Status: DC
  Administered 2020-12-21 – 2020-12-22 (×2): 3 mL via INTRAVENOUS

## 2020-12-21 MED ORDER — PHENOL 1.4 % MT LIQD: 2.0000 | OROMUCOSAL | Status: DC | PRN

## 2020-12-21 MED ORDER — OXYCODONE HCL 5 MG PO TABS: 5.0000 mg | ORAL_TABLET | Freq: Once | ORAL | Status: DC | PRN

## 2020-12-21 MED ORDER — SUGAMMADEX SODIUM 200 MG/2ML IV SOLN
INTRAVENOUS | Status: DC | PRN
  Administered 2020-12-21: 150 mg via INTRAVENOUS

## 2020-12-21 MED ORDER — LIDOCAINE 2% (20 MG/ML) 5 ML SYRINGE
INTRAMUSCULAR | Status: DC | PRN
  Administered 2020-12-21: 60 mg via INTRAVENOUS

## 2020-12-21 MED ORDER — ALUM & MAG HYDROXIDE-SIMETH 200-200-20 MG/5ML PO SUSP: 30.0000 mL | Freq: Four times a day (QID) | ORAL | Status: DC | PRN

## 2020-12-21 MED ORDER — MENTHOL 3 MG MT LOZG: 1.0000 | LOZENGE | OROMUCOSAL | Status: DC | PRN

## 2020-12-21 MED ORDER — ROCURONIUM BROMIDE 10 MG/ML (PF) SYRINGE
PREFILLED_SYRINGE | INTRAVENOUS | Status: DC | PRN
  Administered 2020-12-21: 40 mg via INTRAVENOUS

## 2020-12-21 MED ORDER — FENTANYL CITRATE (PF) 250 MCG/5ML IJ SOLN
INTRAMUSCULAR | Status: DC | PRN
  Administered 2020-12-21: 25 ug via INTRAVENOUS
  Administered 2020-12-21 (×3): 50 ug via INTRAVENOUS
  Administered 2020-12-21: 25 ug via INTRAVENOUS

## 2020-12-21 MED ORDER — LACTATED RINGERS IV SOLN: INTRAVENOUS | Status: DC

## 2020-12-21 MED ORDER — LACTATED RINGERS IV BOLUS: 1000.0000 mL | Freq: Three times a day (TID) | INTRAVENOUS | Status: DC | PRN

## 2020-12-21 MED ORDER — MIDAZOLAM HCL 2 MG/2ML IJ SOLN
INTRAMUSCULAR | Status: DC | PRN
  Administered 2020-12-21: 2 mg via INTRAVENOUS

## 2020-12-21 MED ORDER — BUPIVACAINE-EPINEPHRINE (PF) 0.25% -1:200000 IJ SOLN
INTRAMUSCULAR | Status: AC
  Filled 2020-12-21: qty 30

## 2020-12-21 MED ORDER — CELECOXIB 200 MG PO CAPS
200.0000 mg | ORAL_CAPSULE | ORAL | Status: AC
  Administered 2020-12-21: 200 mg via ORAL
  Filled 2020-12-21: qty 1

## 2020-12-21 MED ORDER — PROPOFOL 10 MG/ML IV BOLUS
INTRAVENOUS | Status: DC | PRN
  Administered 2020-12-21: 150 mg via INTRAVENOUS

## 2020-12-21 MED ORDER — SCOPOLAMINE 1 MG/3DAYS TD PT72
MEDICATED_PATCH | TRANSDERMAL | Status: AC
  Administered 2020-12-21: 1.5 mg via TRANSDERMAL
  Filled 2020-12-21: qty 1

## 2020-12-21 MED ORDER — EPHEDRINE SULFATE-NACL 50-0.9 MG/10ML-% IV SOSY
PREFILLED_SYRINGE | INTRAVENOUS | Status: DC | PRN
  Administered 2020-12-21 (×2): 5 mg via INTRAVENOUS

## 2020-12-21 MED ORDER — PROPOFOL 10 MG/ML IV BOLUS
INTRAVENOUS | Status: AC
  Filled 2020-12-21: qty 20

## 2020-12-21 MED ORDER — METHOCARBAMOL 1000 MG/10ML IJ SOLN
1000.0000 mg | Freq: Four times a day (QID) | INTRAVENOUS | Status: DC | PRN
  Filled 2020-12-21: qty 10

## 2020-12-21 MED ORDER — ACETAMINOPHEN 500 MG PO TABS
1000.0000 mg | ORAL_TABLET | ORAL | Status: AC
  Administered 2020-12-21: 1000 mg via ORAL
  Filled 2020-12-21: qty 2

## 2020-12-21 MED ORDER — ONDANSETRON HCL 4 MG/2ML IJ SOLN: 4.0000 mg | Freq: Once | INTRAMUSCULAR | Status: DC | PRN

## 2020-12-21 MED ORDER — HYDROMORPHONE HCL 1 MG/ML IJ SOLN
0.2500 mg | INTRAMUSCULAR | Status: DC | PRN
  Administered 2020-12-21 (×2): 0.5 mg via INTRAVENOUS

## 2020-12-21 MED ORDER — DEXAMETHASONE SODIUM PHOSPHATE 10 MG/ML IJ SOLN
INTRAMUSCULAR | Status: DC | PRN
  Administered 2020-12-21: 8 mg via INTRAVENOUS

## 2020-12-21 MED ORDER — ENALAPRILAT 1.25 MG/ML IV SOLN
0.6250 mg | Freq: Four times a day (QID) | INTRAVENOUS | Status: DC | PRN
  Filled 2020-12-21: qty 1

## 2020-12-21 MED ORDER — MIDAZOLAM HCL 2 MG/2ML IJ SOLN
INTRAMUSCULAR | Status: AC
  Filled 2020-12-21: qty 2

## 2020-12-21 MED ORDER — MAGIC MOUTHWASH
15.0000 mL | Freq: Four times a day (QID) | ORAL | Status: DC | PRN
  Filled 2020-12-21: qty 15

## 2020-12-21 MED ORDER — ACETAMINOPHEN 500 MG PO TABS
1000.0000 mg | ORAL_TABLET | Freq: Four times a day (QID) | ORAL | Status: DC
  Administered 2020-12-21 – 2020-12-22 (×4): 1000 mg via ORAL
  Filled 2020-12-21 (×4): qty 2

## 2020-12-21 MED ORDER — METHOCARBAMOL 500 MG PO TABS
1000.0000 mg | ORAL_TABLET | Freq: Four times a day (QID) | ORAL | Status: DC | PRN
  Administered 2020-12-21: 1000 mg via ORAL
  Filled 2020-12-21 (×2): qty 2

## 2020-12-21 MED ORDER — DEXAMETHASONE SODIUM PHOSPHATE 10 MG/ML IJ SOLN
INTRAMUSCULAR | Status: AC
  Filled 2020-12-21: qty 1

## 2020-12-21 MED ORDER — PIPERACILLIN-TAZOBACTAM 3.375 G IVPB
3.3750 g | Freq: Three times a day (TID) | INTRAVENOUS | Status: DC
  Administered 2020-12-21 – 2020-12-22 (×4): 3.375 g via INTRAVENOUS
  Filled 2020-12-21 (×4): qty 50

## 2020-12-21 MED ORDER — SODIUM CHLORIDE 0.9 % IV SOLN: 250.0000 mL | INTRAVENOUS | Status: DC | PRN

## 2020-12-21 MED ORDER — ONDANSETRON HCL 4 MG/2ML IJ SOLN
INTRAMUSCULAR | Status: DC | PRN
  Administered 2020-12-21: 4 mg via INTRAVENOUS

## 2020-12-21 MED ORDER — BISACODYL 10 MG RE SUPP: 10.0000 mg | Freq: Two times a day (BID) | RECTAL | Status: DC | PRN

## 2020-12-21 MED ORDER — 0.9 % SODIUM CHLORIDE (POUR BTL) OPTIME
TOPICAL | Status: DC | PRN
  Administered 2020-12-21: 3000 mL

## 2020-12-21 MED ORDER — OXYCODONE HCL 5 MG/5ML PO SOLN: 5.0000 mg | Freq: Once | ORAL | Status: DC | PRN

## 2020-12-21 SURGICAL SUPPLY — 58 items
APL PRP STRL LF DISP 70% ISPRP (MISCELLANEOUS) ×1
APPLIER CLIP 5 13 M/L LIGAMAX5 (MISCELLANEOUS)
APPLIER CLIP ROT 10 11.4 M/L (STAPLE)
APR CLP MED LRG 11.4X10 (STAPLE)
APR CLP MED LRG 5 ANG JAW (MISCELLANEOUS)
BAG COUNTER SPONGE SURGICOUNT (BAG) IMPLANT
BAG SPEC RTRVL 10 TROC 200 (ENDOMECHANICALS) ×1
BAG SPNG CNTER NS LX DISP (BAG)
CABLE HIGH FREQUENCY MONO STRZ (ELECTRODE) ×2 IMPLANT
CHLORAPREP W/TINT 26 (MISCELLANEOUS) ×2 IMPLANT
CLIP APPLIE 5 13 M/L LIGAMAX5 (MISCELLANEOUS) IMPLANT
CLIP APPLIE ROT 10 11.4 M/L (STAPLE) IMPLANT
COVER SURGICAL LIGHT HANDLE (MISCELLANEOUS) ×2 IMPLANT
DECANTER SPIKE VIAL GLASS SM (MISCELLANEOUS) ×2 IMPLANT
DEVICE TROCAR PUNCTURE CLOSURE (ENDOMECHANICALS) IMPLANT
DRAPE LAPAROSCOPIC ABDOMINAL (DRAPES) ×2 IMPLANT
DRAPE WARM FLUID 44X44 (DRAPES) ×2 IMPLANT
DRSG TEGADERM 2-3/8X2-3/4 SM (GAUZE/BANDAGES/DRESSINGS) ×5 IMPLANT
DRSG TEGADERM 4X4.75 (GAUZE/BANDAGES/DRESSINGS) ×2 IMPLANT
ELECT REM PT RETURN 15FT ADLT (MISCELLANEOUS) ×2 IMPLANT
ENDOLOOP SUT PDS II  0 18 (SUTURE)
ENDOLOOP SUT PDS II 0 18 (SUTURE) IMPLANT
GAUZE SPONGE 2X2 8PLY STRL LF (GAUZE/BANDAGES/DRESSINGS) ×1 IMPLANT
GLOVE SURG NEOPR MICRO LF SZ8 (GLOVE) ×2 IMPLANT
GLOVE SURG UNDER LTX SZ8 (GLOVE) ×2 IMPLANT
GOWN STRL REUS W/TWL XL LVL3 (GOWN DISPOSABLE) ×4 IMPLANT
IRRIG SUCT STRYKERFLOW 2 WTIP (MISCELLANEOUS) ×2
IRRIGATION SUCT STRKRFLW 2 WTP (MISCELLANEOUS) ×1 IMPLANT
KIT BASIN OR (CUSTOM PROCEDURE TRAY) ×2 IMPLANT
KIT TURNOVER KIT A (KITS) ×2 IMPLANT
PAD POSITIONING PINK XL (MISCELLANEOUS) ×2 IMPLANT
PENCIL SMOKE EVACUATOR (MISCELLANEOUS) IMPLANT
POUCH RETRIEVAL ECOSAC 10 (ENDOMECHANICALS) ×1 IMPLANT
POUCH RETRIEVAL ECOSAC 10MM (ENDOMECHANICALS) ×2
RELOAD STAPLE 60 3.6 BLU REG (STAPLE) IMPLANT
RELOAD STAPLE 60 4.1 GRN THCK (STAPLE) IMPLANT
RELOAD STAPLER BLUE 60MM (STAPLE) ×1 IMPLANT
RELOAD STAPLER GREEN 60MM (STAPLE) IMPLANT
SCISSORS LAP 5X35 DISP (ENDOMECHANICALS) ×2 IMPLANT
SET TUBE SMOKE EVAC HIGH FLOW (TUBING) ×2 IMPLANT
SHEARS HARMONIC ACE PLUS 36CM (ENDOMECHANICALS) IMPLANT
SLEEVE XCEL OPT CAN 5 100 (ENDOMECHANICALS) ×3 IMPLANT
SPONGE GAUZE 2X2 STER 10/PKG (GAUZE/BANDAGES/DRESSINGS) ×1
STAPLE ECHEON FLEX 60 POW ENDO (STAPLE) ×1 IMPLANT
STAPLER RELOAD BLUE 60MM (STAPLE) ×2
STAPLER RELOAD GREEN 60MM (STAPLE)
SUT MNCRL AB 4-0 PS2 18 (SUTURE) ×2 IMPLANT
SUT PDS AB 0 CT1 36 (SUTURE) IMPLANT
SUT PDS AB 1 CT1 27 (SUTURE) ×1 IMPLANT
SUT SILK 2 0 SH (SUTURE) IMPLANT
SUT V-LOC BARB 180 2/0GR6 GS22 (SUTURE) ×4
SUTURE V-LC BRB 180 2/0GR6GS22 (SUTURE) IMPLANT
TOWEL OR 17X26 10 PK STRL BLUE (TOWEL DISPOSABLE) ×2 IMPLANT
TRAY FOLEY MTR SLVR 14FR STAT (SET/KITS/TRAYS/PACK) ×2 IMPLANT
TRAY FOLEY MTR SLVR 16FR STAT (SET/KITS/TRAYS/PACK) IMPLANT
TRAY LAPAROSCOPIC (CUSTOM PROCEDURE TRAY) ×2 IMPLANT
TROCAR BLADELESS OPT 5 100 (ENDOMECHANICALS) ×2 IMPLANT
TROCAR XCEL 12X100 BLDLESS (ENDOMECHANICALS) ×2 IMPLANT

## 2020-12-21 NOTE — Anesthesia Preprocedure Evaluation (Addendum)
Anesthesia Evaluation  Patient identified by MRN, date of birth, ID band Patient awake    Reviewed: Allergy & Precautions, NPO status , Patient's Chart, lab work & pertinent test results  Airway Mallampati: III  TM Distance: >3 FB Neck ROM: Full    Dental no notable dental hx. (+) Teeth Intact, Dental Advisory Given   Pulmonary neg pulmonary ROS,    Pulmonary exam normal breath sounds clear to auscultation       Cardiovascular hypertension, Pt. on medications Normal cardiovascular exam Rhythm:Regular Rate:Normal     Neuro/Psych negative neurological ROS  negative psych ROS   GI/Hepatic Neg liver ROS, Acute appendicitis Nausea and Vomiting   Endo/Other  Hypothyroidism   Renal/GU negative Renal ROS  negative genitourinary   Musculoskeletal negative musculoskeletal ROS (+)   Abdominal (+) - obese,  Abdomen: tender.    Peds  Hematology negative hematology ROS (+)   Anesthesia Other Findings   Reproductive/Obstetrics                           Anesthesia Physical Anesthesia Plan  ASA: 2  Anesthesia Plan: General   Post-op Pain Management:    Induction: Intravenous, Cricoid pressure planned and Rapid sequence  PONV Risk Score and Plan: 4 or greater and Treatment may vary due to age or medical condition, Midazolam, Scopolamine patch - Pre-op, Ondansetron and Dexamethasone  Airway Management Planned: Oral ETT  Additional Equipment: None  Intra-op Plan:   Post-operative Plan: Extubation in OR  Informed Consent: I have reviewed the patients History and Physical, chart, labs and discussed the procedure including the risks, benefits and alternatives for the proposed anesthesia with the patient or authorized representative who has indicated his/her understanding and acceptance.     Dental advisory given  Plan Discussed with: CRNA and Anesthesiologist  Anesthesia Plan Comments:         Anesthesia Quick Evaluation

## 2020-12-21 NOTE — Transfer of Care (Signed)
Immediate Anesthesia Transfer of Care Note  Patient: Anna Mills  Procedure(s) Performed: APPENDECTOMY LAPAROSCOPIC,LAPAROSCOPIC COLON REPAIR (Abdomen)  Patient Location: PACU  Anesthesia Type:General  Level of Consciousness: drowsy and patient cooperative  Airway & Oxygen Therapy: Patient Spontanous Breathing and Patient connected to face mask oxygen  Post-op Assessment: Report given to RN and Post -op Vital signs reviewed and stable  Post vital signs: Reviewed and stable  Last Vitals:  Vitals Value Taken Time  BP 150/79 12/21/20 1327  Temp    Pulse 81 12/21/20 1330  Resp 12 12/21/20 1329  SpO2 100 % 12/21/20 1330  Vitals shown include unvalidated device data.  Last Pain:  Vitals:   12/21/20 0620  TempSrc:   PainSc: 2       Patients Stated Pain Goal: 0 ([id number])  Complications: No notable events documented.

## 2020-12-21 NOTE — ED Notes (Signed)
ED TO INPATIENT HANDOFF REPORT  Name/Age/Gender Anna Mills 59 y.o. female  Code Status    Code Status Orders  (From admission, onward)         Start     Ordered   12/20/20 2206  Full code  Continuous        12/20/20 2205        Code Status History    This patient has a current code status but no historical code status.      Home/SNF/Other Home  Chief Complaint Acute appendicitis [K35.80]  Level of Care/Admitting Diagnosis ED Disposition    ED Disposition  Admit   Condition  --   Comment  Hospital Area: [facility] [[id number]]  Level of Care: Med-Surg [16]  May place patient in observation at [facility] or [place] if equivalent level of care is available:: No  Covid Evaluation: Asymptomatic Screening Protocol (No Symptoms)  Diagnosis: Acute appendicitis [id number]  Admitting Physician: [place], [place]  Attending Physician: [facility], [place]         Medical History Past Medical History:  Diagnosis Date  . Hypertension   . Insomnia   . Plantar fasciitis   . Thyroid disease   . Varicose veins     Allergies Allergies  Allergen Reactions  . Valcyte [Valganciclovir Hcl] Swelling    *Valtrex    IV Location/Drains/Wounds Patient Lines/Drains/Airways Status    Active Line/Drains/Airways    Name Placement date Placement time Site Days   Peripheral IV 12/20/20 20 G Right Antecubital 12/20/20  1929  Antecubital  1          Labs/Imaging Results for orders placed or performed during the hospital encounter of 12/20/20 (from the past 48 hour(s))  Lipase, blood     Status: Abnormal   Collection Time: 12/20/20  4:30 PM  Result Value Ref Range   Lipase 65 (H) 11 - 51 U/L    Comment: Performed at [facility], [place] [address] [place], [place] [address]  Comprehensive metabolic panel     Status: Abnormal   Collection Time: 12/20/20  4:30 PM  Result Value Ref Range   Sodium 136 135 - 145 mmol/L   Potassium 4.4 3.5  - 5.1 mmol/L   Chloride 104 98 - 111 mmol/L   CO2 24 22 - 32 mmol/L   Glucose, Bld 112 (H) 70 - 99 mg/dL    Comment: Glucose reference range applies only to samples taken after fasting for at least 8 hours.   BUN 15 6 - 20 mg/dL   Creatinine, Ser 0.71 0.44 - 1.00 mg/dL   Calcium 9.3 8.9 - 10.3 mg/dL   Total Protein 7.4 6.5 - 8.1 g/dL   Albumin 4.4 3.5 - 5.0 g/dL   AST 33 15 - 41 U/L   ALT 32 0 - 44 U/L   Alkaline Phosphatase 67 38 - 126 U/L   Total Bilirubin 0.9 0.3 - 1.2 mg/dL   GFR, Estimated >60 >60 mL/min    Comment: (NOTE) Calculated using the CKD-EPI Creatinine Equation (2021)    Anion gap 8 5 - 15    Comment: Performed at [facility], [place] [address], [place], [place] [address]  CBC     Status: Abnormal   Collection Time: 12/20/20  4:30 PM  Result Value Ref Range   WBC 15.2 (H) 4.0 - 10.5 K/uL   RBC 4.52 3.87 - 5.11 MIL/uL   Hemoglobin 14.3 12.0 - 15.0 g/dL  HCT 40.8 36.0 - 46.0 %   MCV 90.3 80.0 - 100.0 fL   MCH 31.6 26.0 - 34.0 pg   MCHC 35.0 30.0 - 36.0 g/dL   RDW 12.7 11.5 - 15.5 %   Platelets 346 150 - 400 K/uL   nRBC 0.0 0.0 - 0.2 %    Comment: Performed at [facility], [place] [address], [place], [place] [address]  Urinalysis, Routine w reflex microscopic     Status: Abnormal   Collection Time: 12/20/20  4:30 PM  Result Value Ref Range   Color, Urine YELLOW YELLOW   APPearance CLEAR CLEAR   Specific Gravity, Urine >1.030 (H) 1.005 - 1.030   pH 6.0 5.0 - 8.0   Glucose, UA NEGATIVE NEGATIVE mg/dL   Hgb urine dipstick NEGATIVE NEGATIVE   Bilirubin Urine NEGATIVE NEGATIVE   Ketones, ur 15 (A) NEGATIVE mg/dL   Protein, ur TRACE (A) NEGATIVE mg/dL   Nitrite NEGATIVE NEGATIVE   Leukocytes,Ua NEGATIVE NEGATIVE    Comment: Performed at [place] [address] [place], [place] [address]  Urinalysis, Microscopic (reflex)     Status: Abnormal   Collection Time: 12/20/20  4:30 PM  Result Value Ref Range    RBC / HPF 0-5 0 - 5 RBC/hpf   WBC, UA 0-5 0 - 5 WBC/hpf   Bacteria, UA RARE (A) NONE SEEN   Squamous Epithelial / LPF 0-5 0 - 5   Mucus PRESENT     Comment: Performed at [facility], [place] [address] [place], [place] [address]  Resp Panel by RT-PCR (Flu A&B, Covid) Nasopharyngeal Swab     Status: None   Collection Time: 12/20/20  8:02 PM   Specimen: Nasopharyngeal Swab; Nasopharyngeal(NP) swabs in vial transport medium  Result Value Ref Range   SARS Coronavirus 2 by RT PCR NEGATIVE NEGATIVE    Comment: (NOTE) SARS-CoV-2 target nucleic acids are NOT DETECTED.  The SARS-CoV-2 RNA is generally detectable in upper respiratory specimens during the acute phase of infection. The lowest concentration of SARS-CoV-2 viral copies this assay can detect is 138 copies/mL. A negative result does not preclude SARS-Cov-2 infection and should not be used as the sole basis for treatment or other patient management decisions. A negative result may occur with  improper specimen collection/handling, submission of specimen other than nasopharyngeal swab, presence of viral mutation(s) within the areas targeted by this assay, and inadequate number of viral copies(<138 copies/mL). A negative result must be combined with clinical observations, patient history, and epidemiological information. The expected result is Negative.  Fact Sheet for Patients:  EntrepreneurPulse.com.au  Fact Sheet for Healthcare Providers:  IncredibleEmployment.be  This test is no t yet approved or cleared by the [place] FDA and  has been authorized for detection and/or diagnosis of SARS-CoV-2 by FDA under an Emergency Use Authorization (EUA). This EUA will remain  in effect (meaning this test can be used) for the duration of the COVID-19 declaration under Section 564(b)(1) of the Act, 21 U.S.C.section 360bbb-3(b)(1), unless the authorization is terminated  or revoked  sooner.       Influenza A by PCR NEGATIVE NEGATIVE   Influenza B by PCR NEGATIVE NEGATIVE    Comment: (NOTE) The Xpert Xpress SARS-CoV-2/FLU/RSV plus assay is intended as an aid in the diagnosis of influenza from Nasopharyngeal swab specimens and should not be used as a sole basis for treatment. Nasal washings and aspirates are unacceptable for Xpert Xpress SARS-CoV-2/FLU/RSV testing.  Fact Sheet for  Patients: EntrepreneurPulse.com.au  Fact Sheet for Healthcare Providers: IncredibleEmployment.be  This test is not yet approved or cleared by the [place] FDA and has been authorized for detection and/or diagnosis of SARS-CoV-2 by FDA under an Emergency Use Authorization (EUA). This EUA will remain in effect (meaning this test can be used) for the duration of the COVID-19 declaration under Section 564(b)(1) of the Act, 21 U.S.C. section 360bbb-3(b)(1), unless the authorization is terminated or revoked.  Performed at [facility], [place] [address], [place], [place] [address]    CT Abdomen Pelvis W Contrast  Result Date: 12/20/2020 CLINICAL DATA:  Mid abdominal pain. EXAM: CT ABDOMEN AND PELVIS WITH CONTRAST TECHNIQUE: Multidetector CT imaging of the abdomen and pelvis was performed using the standard protocol following bolus administration of intravenous contrast. CONTRAST:  74m OMNIPAQUE IOHEXOL 350 MG/ML SOLN COMPARISON:  None. FINDINGS: Lower chest: No acute abnormality. Hepatobiliary: No focal liver abnormality is seen. No gallstones, gallbladder wall thickening, or biliary dilatation. Pancreas: Unremarkable. No pancreatic ductal dilatation or surrounding inflammatory changes. Spleen: Normal in size without focal abnormality. Adrenals/Urinary Tract: Adrenal glands are unremarkable. Kidneys are normal, without renal calculi, focal lesion, or hydronephrosis. Bladder is unremarkable. Stomach/Bowel: There is a small hiatal hernia.  The appendix is markedly inflamed. Associated periappendiceal inflammatory fat stranding is seen without evidence of associated perforation or abscess. No evidence of bowel dilatation. Noninflamed diverticula are seen throughout the sigmoid colon Vascular/Lymphatic: No significant vascular findings are present. No enlarged abdominal or pelvic lymph nodes. Reproductive: Uterus and bilateral adnexa are unremarkable. Other: No abdominal wall hernia or abnormality. No abdominopelvic ascites. Musculoskeletal: No acute or significant osseous findings. IMPRESSION: 1. Marked severity appendicitis without associated perforation or abscess. 2. Sigmoid diverticulosis. 3. Small hiatal hernia. Electronically Signed   By: TVirgina NorfolkM.D.   On: 12/20/2020 20:00    Pending Labs Unresulted Labs (From admission, onward)    Start     Ordered   12/20/20 2206  HIV Antibody (routine testing w rflx)  (HIV Antibody (Routine testing w reflex) panel)  Once,   STAT        12/20/20 2205          Vitals/Pain Today's Vitals   12/20/20 2030 12/20/20 2115 12/21/20 0015 12/21/20 0048  BP: (!) 142/77 140/88 118/86   Pulse: 81 70 84   Resp: '16 18 16   '$ Temp:      TempSrc:      SpO2: 99% 100% 100%   PainSc:    3     Isolation Precautions No active isolations  Medications Medications  lisinopril (ZESTRIL) tablet 5 mg (has no administration in time range)  enoxaparin (LOVENOX) injection 40 mg (has no administration in time range)  0.9 %  sodium chloride infusion ( Intravenous New Bag/Given 12/21/20 0016)  morphine 2 MG/ML injection 2-4 mg (has no administration in time range)  diphenhydrAMINE (BENADRYL) capsule 25 mg (has no administration in time range)    Or  diphenhydrAMINE (BENADRYL) injection 25 mg (has no administration in time range)  ondansetron (ZOFRAN-ODT) disintegrating tablet 4 mg (has no administration in time range)    Or  ondansetron (ZOFRAN) injection 4 mg (has no administration in time range)   levothyroxine (SYNTHROID) tablet 112 mcg (has no administration in time range)  acetaminophen (TYLENOL) tablet 650 mg (650 mg Oral Given 12/21/20 0013)  zolpidem (AMBIEN) tablet 5 mg (5 mg Oral Given 12/21/20 0013)  iohexol (OMNIPAQUE) 350 MG/ML injection 80 mL (80 mLs Intravenous Contrast Given  12/20/20 1937)  piperacillin-tazobactam (ZOSYN) IVPB 3.375 g (0 g Intravenous Stopped 12/20/20 2235)    Mobility walks

## 2020-12-21 NOTE — ED Notes (Signed)
Pt moved to hospital bed for comfort. Pt requesting IV be placed in another location

## 2020-12-21 NOTE — Op Note (Signed)
PATIENT:  Anna Mills  60 y.o. female  Patient Care Team: Marda Stalker, PA-C as PCP - General (Family Medicine)  PRE-OPERATIVE DIAGNOSIS:  APPENDICITIS  POST-OPERATIVE DIAGNOSIS:   ACUTE PHLEGMONOUS APPENDICITIS  PROCEDURE:   APPENDECTOMY LAPAROSCOPIC LAPAROSCOPIC COLON REPAIR  SURGEON:  Adin Hector, MD  ANESTHESIA:   local and general  EBL:  Total I/O In: 1558.8 [I.V.:1415; IV Piggyback:143.8] Out: 260 [Urine:250; Blood:10]  Delay start of Pharmacological VTE agent (>24hrs) due to surgical blood loss or risk of bleeding:  no  DRAINS: none   SPECIMEN:  APPENDIX  DISPOSITION OF SPECIMEN:  PATHOLOGY  COUNTS:  YES  PLAN OF CARE: Admit to inpatient   PATIENT DISPOSITION:  PACU - hemodynamically stable.   INDICATIONS: Patient with concerning symptoms & work up suspicious for appendicitis.  Surgery was recommended:  The anatomy & physiology of the digestive tract was discussed.  The pathophysiology of appendicitis was discussed.  Natural history risks without surgery was discussed.   I feel the risks of no intervention will lead to serious problems that outweigh the operative risks; therefore, I recommended diagnostic laparoscopy with removal of appendix to remove the pathology.  Laparoscopic & open techniques were discussed.   I noted a good likelihood this will help address the problem.    Risks such as bleeding, infection, abscess, leak, reoperation, possible ostomy, hernia, heart attack, death, and other risks were discussed.  Goals of post-operative recovery were discussed as well.  We will work to minimize complications.  Questions were answered.  The patient expresses understanding & wishes to proceed with surgery.  OR FINDINGS: Thickened appendix with phlegmon very stuck to the cecum and ascending colon.  Breach into ascending colon at the tip and appendix, around it.  This was primarily repaired laparoscopically.  CASE DATA:  Type of patient?: LDOW CASE  (Surgical Hospitalist [facility] Inpatient)  Status of Case? URGENT Add On  Infection Present At Time Of Surgery (PATOS)?  PHLEGMON  DESCRIPTION:   The patient was identified & brought into the operating room. The patient was positioned supine with arms tucked. SCDs were active during the entire case. The patient underwent general anesthesia without any difficulty.  The abdomen was prepped and draped in a sterile fashion. A Surgical Timeout confirmed our plan.  I made a transverse incision through the superior umbilical fold.  I made a small transverse nick through the infraumbilical fascia and confirmed peritoneal entry.  I placed a 78m port.  We induced carbon dioxide insufflation.  Camera inspection revealed no injury.  I placed additional ports under direct laparoscopic visualization.    I mobilized the terminal ileum to proximal ascending colon in a lateral to medial fashion.  I took care to avoid injuring any retroperitoneal structures.  Upon rolling the ileocecal region off the retroperitoneum, I encountered a phlegmon involving the cecum and ascending colon. .I freed the appendix off its attachments to the ascending colon and cecal mesentery.  It was quite adherent to it and I ended up breeching into the antimesenteric proximal ascending colon to get the appendix out tip off.  I elevated the appendix. I skeletonized the mesoappendix. I was able to free off the base of the appendix which was still viable.  I stapled the appendix off the cecum using a laparoscopic stapler. I took a healthy cuff of viable cecum. I ligated the mesoappendix and assured hemostasis in the mesentery.  I placed the appendix inside an EcoSac bag and removed out the 173mstapler port.  Isolated the colotomy on the proximal ascending colon.  Minimal stool contamination.  Aspirated.  Edges clean.  I closed the colotomy longitudinally using 2 OV lock in a running Ada function laparoscopically starting from each corner and  meeting the middle to good result.  I then run suture for a second layer closure.  There was no extra omentum to try and do omentopexy with it.  I did meticulous inspection and saw no breach with viability.  No leak of stool nor gas.  Airtight closure.  I did copious irrigation. Hemostasis was good in the mesoappendix, colon mesentery, and retroperitoneum. Staple line was intact on the cecum with no bleeding. I washed out the pelvis, retrohepatic space and right paracolic gutter. I washed out the left side as well.  Hemostasis is good. There was no perforation or injury.  Because the area cleaned up well after irrigation, I did not place a drain.  I closed the 12 mm stapler port site with a #1 PDS stitch using a suture passer under direct laparoscopic visualization.   I aspirated the carbon dioxide. I removed the ports.  I closed skin using 4-0 monocryl stitch.  Sterile dressings applied.  Patient was extubated and sent to the recovery room.  I discussed the operative findings with the patient's spouse, Anna Mills.  I suspect the patient is going used in the hospital at least overnight and will need antibiotics overnight. Questions answered. They expressed understanding and appreciation.  Adin Hector, M.D., F.A.C.S. Gastrointestinal and Minimally Invasive Surgery [facility] [address] [place], [place] [address] [phone] Main / Paging  12/21/2020 1:42 PM

## 2020-12-21 NOTE — Anesthesia Procedure Notes (Signed)
Procedure Name: Intubation Date/Time: 12/21/2020 11:50 AM Performed by: Eben Burow, CRNA Pre-anesthesia Checklist: Patient identified, Emergency Drugs available, Suction available, Patient being monitored and Timeout performed Patient Re-evaluated:Patient Re-evaluated prior to induction Oxygen Delivery Method: Circle system utilized Preoxygenation: Pre-oxygenation with 100% oxygen Induction Type: IV induction and Rapid sequence Laryngoscope Size: Mac and 4 Grade View: Grade I Tube type: Oral Tube size: 7.0 mm Number of attempts: 1 Airway Equipment and Method: Stylet Placement Confirmation: ETT inserted through vocal cords under direct vision, positive ETCO2 and breath sounds checked- equal and bilateral Secured at: 21 cm Tube secured with: Tape Dental Injury: Teeth and Oropharynx as per pre-operative assessment

## 2020-12-21 NOTE — Interval H&P Note (Signed)
History and Physical Interval Note:  12/21/2020 9:33 AM  Anna Mills  has presented today for surgery, with the diagnosis of APPENDICITIS.  The various methods of treatment have been discussed with the patient and family. After consideration of risks, benefits and other options for treatment, the patient has consented to  Procedure(s): APPENDECTOMY LAPAROSCOPIC (N/A) as a surgical intervention.  The patient's history has been reviewed, patient examined, no change in status, stable for surgery.  I have reviewed the patient's chart and labs.  Questions were answered to the patient's satisfaction.    The anatomy & physiology of the digestive tract was discussed.  The pathophysiology of appendicitis and other appendiceal disorders were discussed.  Natural history risks without surgery was discussed.   I feel the risks of no intervention will lead to serious problems that outweigh the operative risks; therefore, I recommended diagnostic laparoscopy with removal of appendix to remove the pathology.  Laparoscopic & open techniques were discussed.   I noted a good likelihood this will help address the problem.   Risks such as bleeding, infection, abscess, leak, reoperation, injury to other organs, need for repair of tissues / organs, possible ostomy, hernia, heart attack, stroke, death, and other risks were discussed.  Goals of post-operative recovery were discussed as well.  We will work to minimize complications.  Questions were answered.  The patient expresses understanding & wishes to proceed with surgery.  I have re-reviewed the the patient's records, history, medications, and allergies.  I have re-examined the patient.  I again discussed intraoperative plans and goals of post-operative recovery.  The patient agrees to proceed.  [place]  06-May-1961 [id number]  Patient Care Team: Marda Stalker, PA-C as PCP - General (Family Medicine)  Patient Active Problem List   Diagnosis Date Noted    Acute appendicitis 12/20/2020    Past Medical History:  Diagnosis Date   Hypertension    Insomnia    Plantar fasciitis    Thyroid disease    Varicose veins     Past Surgical History:  Procedure Laterality Date   LASER ABLATION Bilateral     Social History   Socioeconomic History   Marital status: Single    Spouse name: Not on file   Number of children: Not on file   Years of education: Not on file   Highest education level: Not on file  Occupational History   Not on file  Tobacco Use   Smoking status: Never   Smokeless tobacco: Never  Vaping Use   Vaping Use: Never used  Substance and Sexual Activity   Alcohol use: No    Alcohol/week: 0.0 standard drinks   Drug use: No   Sexual activity: Not on file  Other Topics Concern   Not on file  Social History Narrative   Not on file   Social Determinants of Health   Financial Resource Strain: Not on file  Food Insecurity: Not on file  Transportation Needs: Not on file  Physical Activity: Not on file  Stress: Not on file  Social Connections: Not on file  Intimate Partner Violence: Not on file    Family History  Problem Relation Age of Onset   Cancer Mother        breast   Peripheral vascular disease Father    Colon cancer Neg Hx    Esophageal cancer Neg Hx    Stomach cancer Neg Hx    Rectal cancer Neg Hx     Facility-Administered Medications Prior to  Admission  Medication Dose Route Frequency Provider Last Rate Last Admin   0.9 %  sodium chloride infusion  500 mL Intravenous Once Milus Banister, MD       Medications Prior to Admission  Medication Sig Dispense Refill Last Dose   levothyroxine (SYNTHROID, LEVOTHROID) 112 MCG tablet Take 112 mcg by mouth daily before breakfast.   12/19/2020   lisinopril (PRINIVIL,ZESTRIL) 10 MG tablet    12/19/2020   zolpidem (AMBIEN) 10 MG tablet Take 10 mg by mouth at bedtime as needed.       Current Facility-Administered Medications  Medication Dose Route Frequency  Provider Last Rate Last Admin   0.9 %  sodium chloride infusion   Intravenous Continuous Donnie Mesa, MD   Stopped at 12/21/20 0529   acetaminophen (TYLENOL) tablet 1,000 mg  1,000 mg Oral On Call to OR Michael Boston, MD       acetaminophen (TYLENOL) tablet 650 mg  650 mg Oral Q6H PRN Donnie Mesa, MD   650 mg at 12/21/20 0535   celecoxib (CELEBREX) capsule 200 mg  200 mg Oral On Call to OR Michael Boston, MD       diphenhydrAMINE (BENADRYL) capsule 25 mg  25 mg Oral Q6H PRN Donnie Mesa, MD       Or   diphenhydrAMINE (BENADRYL) injection 25 mg  25 mg Intravenous Q6H PRN Donnie Mesa, MD       enoxaparin (LOVENOX) injection 40 mg  40 mg Subcutaneous Q24H Donnie Mesa, MD       gabapentin (NEURONTIN) capsule 300 mg  300 mg Oral On Call to OR Michael Boston, MD       levothyroxine (SYNTHROID) tablet 112 mcg  112 mcg Oral Q0600 Donnie Mesa, MD   112 mcg at 12/21/20 0530   lisinopril (ZESTRIL) tablet 5 mg  5 mg Oral Daily Donnie Mesa, MD       morphine 2 MG/ML injection 2-4 mg  2-4 mg Intravenous Q2H PRN Donnie Mesa, MD       ondansetron (ZOFRAN-ODT) disintegrating tablet 4 mg  4 mg Oral Q6H PRN Donnie Mesa, MD       Or   ondansetron [facility]) injection 4 mg  4 mg Intravenous Q6H PRN Donnie Mesa, MD       piperacillin-tazobactam (ZOSYN) IVPB 3.375 g  3.375 g Intravenous Odette Fraction, MD 12.5 mL/hr at 12/21/20 0600 Infusion Verify at 12/21/20 0600   zolpidem (AMBIEN) tablet 5 mg  5 mg Oral QHS PRN Donnie Mesa, MD   5 mg at 12/21/20 0013     Allergies  Allergen Reactions   Valcyte [Valganciclovir Hcl] Swelling    *Valtrex    BP 113/69 (BP Location: Right Arm)   Pulse 70   Temp 98.2 F (36.8 C) (Oral)   Resp 16   SpO2 97%   Labs: Results for orders placed or performed during the hospital encounter of 12/20/20 (from the past 48 hour(s))  Lipase, blood     Status: Abnormal   Collection Time: 12/20/20  4:30 PM  Result Value Ref Range   Lipase 65 (H) 11 - 51  U/L    Comment: Performed at [facility], [place] [address], [place], [place] [address]  Comprehensive metabolic panel     Status: Abnormal   Collection Time: 12/20/20  4:30 PM  Result Value Ref Range   Sodium 136 135 - 145 mmol/L   Potassium 4.4 3.5 - 5.1 mmol/L   Chloride 104 98 - 111 mmol/L  CO2 24 22 - 32 mmol/L   Glucose, Bld 112 (H) 70 - 99 mg/dL    Comment: Glucose reference range applies only to samples taken after fasting for at least 8 hours.   BUN 15 6 - 20 mg/dL   Creatinine, Ser 0.71 0.44 - 1.00 mg/dL   Calcium 9.3 8.9 - 10.3 mg/dL   Total Protein 7.4 6.5 - 8.1 g/dL   Albumin 4.4 3.5 - 5.0 g/dL   AST 33 15 - 41 U/L   ALT 32 0 - 44 U/L   Alkaline Phosphatase 67 38 - 126 U/L   Total Bilirubin 0.9 0.3 - 1.2 mg/dL   GFR, Estimated >60 >60 mL/min    Comment: (NOTE) Calculated using the CKD-EPI Creatinine Equation (2021)    Anion gap 8 5 - 15    Comment: Performed at [facility], [place] [address] [place], [place] [address]  CBC     Status: Abnormal   Collection Time: 12/20/20  4:30 PM  Result Value Ref Range   WBC 15.2 (H) 4.0 - 10.5 K/uL   RBC 4.52 3.87 - 5.11 MIL/uL   Hemoglobin 14.3 12.0 - 15.0 g/dL   HCT 40.8 36.0 - 46.0 %   MCV 90.3 80.0 - 100.0 fL   MCH 31.6 26.0 - 34.0 pg   MCHC 35.0 30.0 - 36.0 g/dL   RDW 12.7 11.5 - 15.5 %   Platelets 346 150 - 400 K/uL   nRBC 0.0 0.0 - 0.2 %    Comment: Performed at [facility], [place] [address], [place], [place] [address]  Urinalysis, Routine w reflex microscopic     Status: Abnormal   Collection Time: 12/20/20  4:30 PM  Result Value Ref Range   Color, Urine YELLOW YELLOW   APPearance CLEAR CLEAR   Specific Gravity, Urine >1.030 (H) 1.005 - 1.030   pH 6.0 5.0 - 8.0   Glucose, UA NEGATIVE NEGATIVE mg/dL   Hgb urine dipstick NEGATIVE NEGATIVE   Bilirubin Urine NEGATIVE NEGATIVE   Ketones, ur 15 (A) NEGATIVE mg/dL   Protein, ur TRACE (A) NEGATIVE mg/dL   Nitrite  NEGATIVE NEGATIVE   Leukocytes,Ua NEGATIVE NEGATIVE    Comment: Performed at [place] [address], [place], [place] [address]  Urinalysis, Microscopic (reflex)     Status: Abnormal   Collection Time: 12/20/20  4:30 PM  Result Value Ref Range   RBC / HPF 0-5 0 - 5 RBC/hpf   WBC, UA 0-5 0 - 5 WBC/hpf   Bacteria, UA RARE (A) NONE SEEN   Squamous Epithelial / LPF 0-5 0 - 5   Mucus PRESENT     Comment: Performed at [facility], [place] [address], [place], [place] [address]  Resp Panel by RT-PCR (Flu A&B, Covid) Nasopharyngeal Swab     Status: None   Collection Time: 12/20/20  8:02 PM   Specimen: Nasopharyngeal Swab; Nasopharyngeal(NP) swabs in vial transport medium  Result Value Ref Range   SARS Coronavirus 2 by RT PCR NEGATIVE NEGATIVE    Comment: (NOTE) SARS-CoV-2 target nucleic acids are NOT DETECTED.  The SARS-CoV-2 RNA is generally detectable in upper respiratory specimens during the acute phase of infection. The lowest concentration of SARS-CoV-2 viral copies this assay can detect is 138 copies/mL. A negative result does not preclude SARS-Cov-2 infection and should not be used as the sole basis for treatment or other patient management decisions. A negative result may occur with  improper specimen  collection/handling, submission of specimen other than nasopharyngeal swab, presence of viral mutation(s) within the areas targeted by this assay, and inadequate number of viral copies(<138 copies/mL). A negative result must be combined with clinical observations, patient history, and epidemiological information. The expected result is Negative.  Fact Sheet for Patients:  EntrepreneurPulse.com.au  Fact Sheet for Healthcare Providers:  IncredibleEmployment.be  This test is no t yet approved or cleared by the [place] FDA and  has been authorized for detection and/or diagnosis of SARS-CoV-2 by FDA  under an Emergency Use Authorization (EUA). This EUA will remain  in effect (meaning this test can be used) for the duration of the COVID-19 declaration under Section 564(b)(1) of the Act, 21 U.S.C.section 360bbb-3(b)(1), unless the authorization is terminated  or revoked sooner.       Influenza A by PCR NEGATIVE NEGATIVE   Influenza B by PCR NEGATIVE NEGATIVE    Comment: (NOTE) The Xpert Xpress SARS-CoV-2/FLU/RSV plus assay is intended as an aid in the diagnosis of influenza from Nasopharyngeal swab specimens and should not be used as a sole basis for treatment. Nasal washings and aspirates are unacceptable for Xpert Xpress SARS-CoV-2/FLU/RSV testing.  Fact Sheet for Patients: EntrepreneurPulse.com.au  Fact Sheet for Healthcare Providers: IncredibleEmployment.be  This test is not yet approved or cleared by the [place] FDA and has been authorized for detection and/or diagnosis of SARS-CoV-2 by FDA under an Emergency Use Authorization (EUA). This EUA will remain in effect (meaning this test can be used) for the duration of the COVID-19 declaration under Section 564(b)(1) of the Act, 21 U.S.C. section 360bbb-3(b)(1), unless the authorization is terminated or revoked.  Performed at [facility], [place] [address] [place], [place] [address]   Surgical PCR screen     Status: None   Collection Time: 12/21/20  4:19 AM   Specimen: Nasal Mucosa; Nasal Swab  Result Value Ref Range   MRSA, PCR NEGATIVE NEGATIVE   Staphylococcus aureus NEGATIVE NEGATIVE    Comment: (NOTE) The Xpert SA Assay (FDA approved for NASAL specimens in patients 68 years of age and older), is one component of a comprehensive surveillance program. It is not intended to diagnose infection nor to guide or monitor treatment. Performed at [facility], [place] [address] [place], [place] [address]     Imaging / Studies: CT Abdomen  Pelvis W Contrast  Result Date: 12/20/2020 CLINICAL DATA:  Mid abdominal pain. EXAM: CT ABDOMEN AND PELVIS WITH CONTRAST TECHNIQUE: Multidetector CT imaging of the abdomen and pelvis was performed using the standard protocol following bolus administration of intravenous contrast. CONTRAST:  44m OMNIPAQUE IOHEXOL 350 MG/ML SOLN COMPARISON:  None. FINDINGS: Lower chest: No acute abnormality. Hepatobiliary: No focal liver abnormality is seen. No gallstones, gallbladder wall thickening, or biliary dilatation. Pancreas: Unremarkable. No pancreatic ductal dilatation or surrounding inflammatory changes. Spleen: Normal in size without focal abnormality. Adrenals/Urinary Tract: Adrenal glands are unremarkable. Kidneys are normal, without renal calculi, focal lesion, or hydronephrosis. Bladder is unremarkable. Stomach/Bowel: There is a small hiatal hernia. The appendix is markedly inflamed. Associated periappendiceal inflammatory fat stranding is seen without evidence of associated perforation or abscess. No evidence of bowel dilatation. Noninflamed diverticula are seen throughout the sigmoid colon Vascular/Lymphatic: No significant vascular findings are present. No enlarged abdominal or pelvic lymph nodes. Reproductive: Uterus and bilateral adnexa are unremarkable. Other: No abdominal wall hernia or abnormality. No abdominopelvic ascites. Musculoskeletal: No acute or significant osseous findings. IMPRESSION: 1. Marked severity appendicitis without associated perforation or abscess.  2. Sigmoid diverticulosis. 3. Small hiatal hernia. Electronically Signed   By: Virgina Norfolk M.D.   On: 12/20/2020 20:00     .Adin Hector, M.D., F.A.C.S. Gastrointestinal and Minimally Invasive Surgery [facility] [address] [place], [place] [address] [phone] Main / Paging  12/21/2020 9:33 AM    Adin Hector

## 2020-12-21 NOTE — Discharge Instructions (Signed)
[facility] ° °Please arrive at least 30 min before your appointment to complete your check in paperwork.  If you are unable to arrive 30 min prior to your appointment time we may have to cancel or reschedule you. °LAPAROSCOPIC SURGERY: POST OP INSTRUCTIONS °Always review your discharge instruction sheet given to you by the facility where your surgery was performed. °IF YOU HAVE DISABILITY OR FAMILY LEAVE FORMS, YOU MUST BRING THEM TO THE OFFICE FOR PROCESSING.   °DO NOT GIVE THEM TO YOUR DOCTOR. ° °PAIN CONTROL ° °First take acetaminophen (Tylenol) AND/or ibuprofen (Advil) to control your pain after surgery.  Follow directions on package.  Taking acetaminophen (Tylenol) and/or ibuprofen (Advil) regularly after surgery will help to control your pain and lower the amount of prescription pain medication you may need.  You should not take more than 4,000 mg (4 grams) of acetaminophen (Tylenol) in 24 hours.  You should not take ibuprofen (Advil), aleve, motrin, naprosyn or other NSAIDS if you have a history of stomach ulcers or chronic kidney disease.  °A prescription for pain medication may be given to you upon discharge.  Take your pain medication as prescribed, if you still have uncontrolled pain after taking acetaminophen (Tylenol) or ibuprofen (Advil). °Use ice packs to help control pain. °If you need a refill on your pain medication, please contact your pharmacy.  They will contact our office to request authorization. Prescriptions will not be filled after 5pm or on week-ends. ° °HOME MEDICATIONS °Take your usually prescribed medications unless otherwise directed. ° °DIET °You should follow a light diet the first few days after arrival home.  Be sure to include lots of fluids daily. Avoid fatty, fried foods.  ° °CONSTIPATION °It is common to experience some constipation after surgery and if you are taking pain medication.  Increasing fluid intake and taking a stool softener (such as Colace)  will usually help or prevent this problem from occurring.  A mild laxative (Milk of Magnesia or Miralax) should be taken according to package instructions if there are no bowel movements after 48 hours. ° °WOUND/INCISION CARE °Most patients will experience some swelling and bruising in the area of the incisions.  Ice packs will help.  Swelling and bruising can take several days to resolve.  °Unless discharge instructions indicate otherwise, follow guidelines below  °STERI-STRIPS - you may remove your outer bandages 48 hours after surgery, and you may shower at that time.  You have steri-strips (small skin tapes) in place directly over the incision.  These strips should be left on the skin for 7-10 days.   °DERMABOND/SKIN GLUE - you may shower in 24 hours.  The glue will flake off over the next 2-3 weeks. °Any sutures or staples will be removed at the office during your follow-up visit. ° °ACTIVITIES °You may resume regular (light) daily activities beginning the next day--such as daily self-care, walking, climbing stairs--gradually increasing activities as tolerated.  You may have sexual intercourse when it is comfortable.  Refrain from any heavy lifting or straining until approved by your doctor. °You may drive when you are no longer taking prescription pain medication, you can comfortably wear a seatbelt, and you can safely maneuver your car and apply brakes. ° °FOLLOW-UP °You should see your doctor in the office for a follow-up appointment approximately 2-3 weeks after your surgery.  You should have been given your post-op/follow-up appointment when your surgery was scheduled.  If you did not receive a post-op/follow-up appointment, make sure   that you call for this appointment within a day or two after you arrive home to insure a convenient appointment time. ° ° °WHEN TO CALL YOUR DOCTOR: °Fever over 101.0 °Inability to urinate °Continued bleeding from incision. °Increased pain, redness, or drainage from the  incision. °Increasing abdominal pain ° °The clinic staff is available to answer your questions during regular business hours.  Please don’t hesitate to call and ask to speak to one of the nurses for clinical concerns.  If you have a medical emergency, go to the nearest emergency room or call 911.  A surgeon from [facility] is always on call at the hospital. °[address], [address], [place], [place]  [address] ? [address] [address], [place], [place]   [address] °[phone] ? [phone] ? FAX [phone] ° ° ° ° °Managing Your Pain After Surgery Without Opioids ° ° ° °Thank you for participating in our program to help patients manage their pain after surgery without opioids. This is part of our effort to provide you with the best care possible, without exposing you or your family to the risk that opioids pose. ° °What pain can I expect after surgery? °You can expect to have some pain after surgery. This is normal. The pain is typically worse the day after surgery, and quickly begins to get better. °Many studies have found that many patients are able to manage their pain after surgery with Over-the-Counter (OTC) medications such as Tylenol and Motrin. If you have a condition that does not allow you to take Tylenol or Motrin, notify your surgical team. ° °How will I manage my pain? °The best strategy for controlling your pain after surgery is around the clock pain control with Tylenol (acetaminophen) and Motrin (ibuprofen or Advil). Alternating these medications with each other allows you to maximize your pain control. In addition to Tylenol and Motrin, you can use heating pads or ice packs on your incisions to help reduce your pain. ° °How will I alternate your regular strength over-the-counter pain medication? °You will take a dose of pain medication every three hours. °Start by taking 650 mg of Tylenol (2 pills of 325 mg) °3 hours later take 600 mg of Motrin (3 pills of 200 mg) °3 hours after  taking the Motrin take 650 mg of Tylenol °3 hours after that take 600 mg of Motrin. ° ° °- 1 - ° °See example - if your first dose of Tylenol is at 12:00 PM ° ° °12:00 PM Tylenol 650 mg (2 pills of 325 mg)  °3:00 PM Motrin 600 mg (3 pills of 200 mg)  °6:00 PM Tylenol 650 mg (2 pills of 325 mg)  °9:00 PM Motrin 600 mg (3 pills of 200 mg)  °Continue alternating every 3 hours  ° °We recommend that you follow this schedule around-the-clock for at least 3 days after surgery, or until you feel that it is no longer needed. Use the table on the last page of this handout to keep track of the medications you are taking. °Important: °Do not take more than 3000mg of Tylenol or 3200mg of Motrin in a 24-hour period. °Do not take ibuprofen/Motrin if you have a history of bleeding stomach ulcers, severe kidney disease, &/or actively taking a blood thinner ° °What if I still have pain? °If you have pain that is not controlled with the over-the-counter pain medications (Tylenol and Motrin or Advil) you might have what we call “breakthrough” pain. You will receive a prescription   for a small amount of an opioid pain medication such as Oxycodone, Tramadol, or Tylenol with Codeine. Use these opioid pills in the first 24 hours after surgery if you have breakthrough pain. Do not take more than 1 pill every 4-6 hours. ° °If you still have uncontrolled pain after using all opioid pills, don't hesitate to call our staff using the number provided. We will help make sure you are managing your pain in the best way possible, and if necessary, we can provide a prescription for additional pain medication. ° ° °Day 1   ° °Time  °Name of Medication Number of pills taken  °Amount of Acetaminophen  °Pain Level  ° °Comments  °AM PM       °AM PM       °AM PM       °AM PM       °AM PM       °AM PM       °AM PM       °AM PM       °Total Daily amount of Acetaminophen °Do not take more than  3,000 mg per day    ° ° °Day 2   ° °Time  °Name of Medication  Number of pills °taken  °Amount of Acetaminophen  °Pain Level  ° °Comments  °AM PM       °AM PM       °AM PM       °AM PM       °AM PM       °AM PM       °AM PM       °AM PM       °Total Daily amount of Acetaminophen °Do not take more than  3,000 mg per day    ° ° °Day 3   ° °Time  °Name of Medication Number of pills taken  °Amount of Acetaminophen  °Pain Level  ° °Comments  °AM PM       °AM PM       °AM PM       °AM PM       ° ° ° °AM PM       °AM PM       °AM PM       °AM PM       °Total Daily amount of Acetaminophen °Do not take more than  3,000 mg per day    ° ° °Day 4   ° °Time  °Name of Medication Number of pills taken  °Amount of Acetaminophen  °Pain Level  ° °Comments  °AM PM       °AM PM       °AM PM       °AM PM       °AM PM       °AM PM       °AM PM       °AM PM       °Total Daily amount of Acetaminophen °Do not take more than  3,000 mg per day    ° ° °Day 5   ° °Time  °Name of Medication Number °of pills taken  °Amount of Acetaminophen  °Pain Level  ° °Comments  °AM PM       °AM PM       °AM PM       °AM PM       °AM PM       °AM   PM       °AM PM       °AM PM       °Total Daily amount of Acetaminophen °Do not take more than  3,000 mg per day    ° ° ° °Day 6   ° °Time  °Name of Medication Number of pills °taken  °Amount of Acetaminophen  °Pain Level  °Comments  °AM PM       °AM PM       °AM PM       °AM PM       °AM PM       °AM PM       °AM PM       °AM PM       °Total Daily amount of Acetaminophen °Do not take more than  3,000 mg per day    ° ° °Day 7   ° °Time  °Name of Medication Number of pills taken  °Amount of Acetaminophen  °Pain Level  ° °Comments  °AM PM       °AM PM       °AM PM       °AM PM       °AM PM       °AM PM       °AM PM       °AM PM       °Total Daily amount of Acetaminophen °Do not take more than  3,000 mg per day    ° ° ° ° °For additional information about how and where to safely dispose of unused opioid °medications - https://www.morepowerfulnc.org ° °Disclaimer: This document  contains information and/or instructional materials adapted from [facility] for the typical patient with your condition. It does not replace medical advice from your health care provider because your experience may differ from that of the °typical patient. Talk to your health care provider if you have any questions about this °document, your condition or your treatment plan. °Adapted from [facility] ° °

## 2020-12-22 ENCOUNTER — Encounter (HOSPITAL_COMMUNITY): Payer: Self-pay | Admitting: Surgery

## 2020-12-22 MED ORDER — AMOXICILLIN-POT CLAVULANATE 875-125 MG PO TABS: 1.0000 | ORAL_TABLET | Freq: Two times a day (BID) | ORAL | 0 refills | Status: AC

## 2020-12-22 MED ORDER — DOCUSATE SODIUM 100 MG PO CAPS: 100.0000 mg | ORAL_CAPSULE | Freq: Every day | ORAL | Status: AC | PRN

## 2020-12-22 MED ORDER — TRAMADOL HCL 50 MG PO TABS: 50.0000 mg | ORAL_TABLET | Freq: Four times a day (QID) | ORAL | 0 refills | Status: AC | PRN

## 2020-12-22 MED ORDER — ACETAMINOPHEN 325 MG PO TABS: 650.0000 mg | ORAL_TABLET | Freq: Four times a day (QID) | ORAL | 0 refills | Status: AC

## 2020-12-22 NOTE — Discharge Summary (Signed)
[facility] Discharge Summary   Patient ID: Anna Mills MRN: [id number] DOB/AGE: 01/28/1961 60 y.o.  Admit date: 12/20/2020 Discharge date: 12/22/2020  Admitting Diagnosis: Appendicitis  Discharge Diagnosis ACUTE PHLEGMONOUS APPENDICITIS s/p APPENDECTOMY LAPAROSCOPIC, LAPAROSCOPIC COLON REPAIR  Consultants None   Imaging: CT Abdomen Pelvis W Contrast  Result Date: 12/20/2020 CLINICAL DATA:  Mid abdominal pain. EXAM: CT ABDOMEN AND PELVIS WITH CONTRAST TECHNIQUE: Multidetector CT imaging of the abdomen and pelvis was performed using the standard protocol following bolus administration of intravenous contrast. CONTRAST:  32m OMNIPAQUE IOHEXOL 350 MG/ML SOLN COMPARISON:  None. FINDINGS: Lower chest: No acute abnormality. Hepatobiliary: No focal liver abnormality is seen. No gallstones, gallbladder wall thickening, or biliary dilatation. Pancreas: Unremarkable. No pancreatic ductal dilatation or surrounding inflammatory changes. Spleen: Normal in size without focal abnormality. Adrenals/Urinary Tract: Adrenal glands are unremarkable. Kidneys are normal, without renal calculi, focal lesion, or hydronephrosis. Bladder is unremarkable. Stomach/Bowel: There is a small hiatal hernia. The appendix is markedly inflamed. Associated periappendiceal inflammatory fat stranding is seen without evidence of associated perforation or abscess. No evidence of bowel dilatation. Noninflamed diverticula are seen throughout the sigmoid colon Vascular/Lymphatic: No significant vascular findings are present. No enlarged abdominal or pelvic lymph nodes. Reproductive: Uterus and bilateral adnexa are unremarkable. Other: No abdominal wall hernia or abnormality. No abdominopelvic ascites. Musculoskeletal: No acute or significant osseous findings. IMPRESSION: 1. Marked severity appendicitis without associated perforation or abscess. 2. Sigmoid diverticulosis. 3. Small hiatal hernia. Electronically Signed   By:  TVirgina NorfolkM.D.   On: 12/20/2020 20:00    Procedures Dr. SMichael Boston(12/21/20) - APPENDECTOMY LAPAROSCOPIC, LAPAROSCOPIC COLON REPAIR  Hospital Course:  60 year old female who presented with acute onset of RLQ abdominal pain beginning at 3 AM, associated with nausea and vomiting.  No prior abdominal surgeries.  She presented to the ED for evaluation and was found to have uncomplicated appendicitis.  Patient was admitted and underwent procedure listed above.  Tolerated procedure well and was transferred to the floor.  Diet was advanced as tolerated.  On POD1, the patient was voiding well, tolerating diet, ambulating well, pain well controlled, vital signs stable, incisions c/d/i and felt stable for discharge home.  Patient will follow up in our office in 3 weeks and knows to call with questions or concerns.    She will be discharged on oral antibiotics for 5 days  Physical Exam: General:  Alert, NAD, pleasant, comfortable Abd:  Soft, ND, mild tenderness, incisions C/D/I  I or a member of my team have reviewed this patient in the Controlled Substance Database   Allergies as of 12/22/2020       Reactions   Valcyte [valganciclovir Hcl] Swelling   *Valtrex        Medication List     TAKE these medications    acetaminophen 325 MG tablet Commonly known as: TYLENOL Take 2 tablets (650 mg total) by mouth every 6 (six) hours for 5 days.   amoxicillin-clavulanate 875-125 MG tablet Commonly known as: Augmentin Take 1 tablet by mouth 2 (two) times daily for 5 days.   docusate sodium 100 MG capsule Commonly known as: Colace Take 1 capsule (100 mg total) by mouth daily as needed for up to 5 days for mild constipation or moderate constipation.   levothyroxine 112 MCG tablet Commonly known as: SYNTHROID Take 112 mcg by mouth daily before breakfast.   lisinopril 10 MG tablet Commonly known as: ZESTRIL   traMADol 50 MG tablet Commonly known as:  Ultram Take 1 tablet (50 mg  total) by mouth every 6 (six) hours as needed for up to 5 days for moderate pain or severe pain.   zolpidem 10 MG tablet Commonly known as: AMBIEN Take 10 mg by mouth at bedtime as needed.          Follow-up Information     Michael Boston, MD Follow up in 3 week(s).   Specialties: General Surgery, Colon and Rectal Surgery Why: arrive at 11:00am for an 11:30am appointment for paperwork and check in process Contact information: [address] [place] [address] [phone]                 Signed: Caroll Rancher [facility] 12/22/2020, 10:40 AM Please see Amion for pager number during day hours 7:00am-4:30pm

## 2020-12-22 NOTE — Plan of Care (Signed)
     Problem: Education: Goal: Knowledge of General Education information will improve Description: Including pain rating scale, medication(s)/side effects and non-pharmacologic comfort measures Outcome: Adequate for Discharge   Problem: Health Behavior/Discharge Planning: Goal: Ability to manage health-related needs will improve Outcome: Adequate for Discharge   Problem: Clinical Measurements: Goal: Ability to maintain clinical measurements within normal limits will improve Outcome: Adequate for Discharge Goal: Will remain free from infection Outcome: Adequate for Discharge Goal: Diagnostic test results will improve Outcome: Adequate for Discharge Goal: Respiratory complications will improve Outcome: Adequate for Discharge Goal: Cardiovascular complication will be avoided Outcome: Adequate for Discharge   Problem: Activity: Goal: Risk for activity intolerance will decrease Outcome: Adequate for Discharge   Problem: Nutrition: Goal: Adequate nutrition will be maintained Outcome: Adequate for Discharge   Problem: Coping: Goal: Level of anxiety will decrease Outcome: Adequate for Discharge   Problem: Elimination: Goal: Will not experience complications related to bowel motility Outcome: Adequate for Discharge Goal: Will not experience complications related to urinary retention Outcome: Adequate for Discharge   Problem: Pain Managment: Goal: General experience of comfort will improve Outcome: Adequate for Discharge   Problem: Safety: Goal: Ability to remain free from injury will improve Outcome: Adequate for Discharge   Problem: Skin Integrity: Goal: Risk for impaired skin integrity will decrease Outcome: Adequate for Discharge  Haydee Salter, RN

## 2020-12-23 LAB — SURGICAL PATHOLOGY

## 2020-12-23 NOTE — Anesthesia Postprocedure Evaluation (Signed)
Anesthesia Post Note  Patient: Anna Mills  Procedure(s) Performed: APPENDECTOMY LAPAROSCOPIC,LAPAROSCOPIC COLON REPAIR (Abdomen)     Patient location during evaluation: PACU Anesthesia Type: General Level of consciousness: awake and alert Pain management: pain level controlled Vital Signs Assessment: post-procedure vital signs reviewed and stable Respiratory status: spontaneous breathing, nonlabored ventilation, respiratory function stable and patient connected to nasal cannula oxygen Cardiovascular status: blood pressure returned to baseline and stable Postop Assessment: no apparent nausea or vomiting Anesthetic complications: no   No notable events documented.  Last Vitals:  Vitals:   12/22/20 0121 12/22/20 0557  BP: 122/64 105/69  Pulse: 89 89  Resp: 16 16  Temp: 37.1 C 36.9 C  SpO2: 98% 98%    Last Pain:  Vitals:   12/22/20 0900  TempSrc:   PainSc: 5                  Tiajuana Amass

## 2021-05-09 HISTORY — PX: ESOPHAGOGASTRODUODENOSCOPY: SHX1529

## 2021-10-23 ENCOUNTER — Emergency Department (HOSPITAL_BASED_OUTPATIENT_CLINIC_OR_DEPARTMENT_OTHER): Payer: 59 | Admitting: Radiology

## 2021-10-23 ENCOUNTER — Other Ambulatory Visit: Payer: Self-pay

## 2021-10-23 ENCOUNTER — Emergency Department (HOSPITAL_BASED_OUTPATIENT_CLINIC_OR_DEPARTMENT_OTHER)
Admission: EM | Admit: 2021-10-23 | Discharge: 2021-10-23 | Disposition: A | Discharge status: Home / Self Care | Payer: 59 | Attending: Emergency Medicine | Admitting: Emergency Medicine

## 2021-10-23 ENCOUNTER — Encounter (HOSPITAL_BASED_OUTPATIENT_CLINIC_OR_DEPARTMENT_OTHER): Payer: Self-pay

## 2021-10-23 DIAGNOSIS — J029 Acute pharyngitis, unspecified: Secondary | ICD-10-CM

## 2021-10-23 DIAGNOSIS — I10 Essential (primary) hypertension: Secondary | ICD-10-CM | POA: Diagnosis not present

## 2021-10-23 DIAGNOSIS — Z79899 Other long term (current) drug therapy: Secondary | ICD-10-CM | POA: Insufficient documentation

## 2021-10-23 DIAGNOSIS — J040 Acute laryngitis: Secondary | ICD-10-CM | POA: Diagnosis not present

## 2021-10-23 LAB — GROUP A STREP BY PCR: Group A Strep by PCR: NOT DETECTED

## 2021-10-23 NOTE — ED Provider Notes (Signed)
[facility] EMERGENCY DEPT Provider Note   CSN: [id number] Arrival date & time: 10/23/21  1435     History  Chief Complaint  Patient presents with   Sore Throat    Anna Mills is a 61 y.o. female who presents to the emergency department complaining of a sore throat onset 4 days.  Notes that prior to the onset of her symptoms she blew up two floaties for the swimming pool and noticed a hoarse voice following.  She was evaluated by her allergist the following day and sent a prescription for amoxicillin, given a steroid injection and sent prescription for omeprazole.  She notes that her throat feels hairy, has had similar symptoms before in the past.  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  Has tried Nasacort and Claritin intermittently for her symptoms.  No sick contacts at home.  Denies fever, chills, postnasal drip, trouble swallowing, nausea, vomiting.   The history is provided by the patient. No language interpreter was used.       Home Medications Prior to Admission medications   Medication Sig Start Date End Date Taking? Authorizing Provider  levothyroxine (SYNTHROID, LEVOTHROID) 112 MCG tablet Take 112 mcg by mouth daily before breakfast.    [provider]  lisinopril (PRINIVIL,ZESTRIL) 10 MG tablet  09/22/17   [provider]  zolpidem (AMBIEN) 10 MG tablet Take 10 mg by mouth at bedtime as needed. 09/05/17   [provider]      Allergies    Codeine and Valcyte [valganciclovir hcl]    Review of Systems   Review of Systems  Constitutional:  Negative for chills and fever.  HENT:  Positive for sore throat. Negative for postnasal drip and trouble swallowing.   Gastrointestinal:  Negative for nausea and vomiting.  All other systems reviewed and are negative.   Physical Exam Updated Vital Signs BP (!) 142/82   Pulse (!) 59   Temp (!) 97.3 F (36.3 C) (Temporal)   Resp 18   Ht '5\' 7"'$   (1.702 m)   Wt 74.8 kg   SpO2 100%   BMI 25.84 kg/m  Physical Exam Vitals and nursing note reviewed.  Constitutional:      General: She is not in acute distress.    Appearance: She is not diaphoretic.  HENT:     Head: Normocephalic and atraumatic.     Mouth/Throat:     Pharynx: No oropharyngeal exudate.     Comments: Uvula midline without swelling.  No posterior pharyngeal erythema or exudate noted. Eyes:     General: No scleral icterus.    Conjunctiva/sclera: Conjunctivae normal.  Cardiovascular:     Rate and Rhythm: Normal rate and regular rhythm.     Pulses: Normal pulses.     Heart sounds: Normal heart sounds.  Pulmonary:     Effort: Pulmonary effort is normal. No respiratory distress.     Breath sounds: Normal breath sounds. No wheezing.  Abdominal:     General: Bowel sounds are normal.     Palpations: Abdomen is soft. There is no mass.     Tenderness: There is no abdominal tenderness. There is no guarding or rebound.  Musculoskeletal:        General: Normal range of motion.     Cervical back: Normal range of motion and neck supple.  Skin:    General: Skin is warm and dry.  Neurological:     Mental Status: She is alert.  Psychiatric:  Behavior: Behavior normal.     ED Results / Procedures / Treatments   Labs (all labs ordered are listed, but only abnormal results are displayed) Labs Reviewed  GROUP A STREP BY PCR    EKG None  Radiology DG Chest 2 View  Result Date: 10/23/2021 CLINICAL DATA:  Shortness of breath and hairy feeling in throat. EXAM: CHEST - 2 VIEW COMPARISON:  None Available. FINDINGS: Lungs are adequately inflated without focal airspace consolidation or effusion. Cardiomediastinal silhouette is normal. Bones and soft tissues are unremarkable. IMPRESSION: No active cardiopulmonary disease. Electronically Signed   By: Marin Olp M.D.   On: 10/23/2021 16:22    Procedures Procedures    Medications Ordered in ED Medications - No data  to display  ED Course/ Medical Decision Making/ A&P                           Medical Decision Making Amount and/or Complexity of Data Reviewed Radiology: ordered.   Pt presents with sore throat onset 4 days.  No sick contacts.  Tried amoxicillin, received a steroid injection, omeprazole, Claritin, Nasacort intermittently for her symptoms.  Vital signs stable, patient afebrile.  On exam patient with uvula midline without swelling.  No tonsillar exudate or erythema appreciated.  No acute cardiovascular or respiratory exam findings. Differential diagnosis includes strep pharyngitis, viral pharyngitis, laryngitis, pneumonia.    Co morbidities that complicate the patient evaluation: Hypertension, thyroid disease  Additional history obtained:  Additional history obtained from Spouse/Significant Other  Labs:  I ordered, and personally interpreted labs.  The pertinent results include:   Strep swab negative  Imaging: I ordered imaging studies including chest x-ray I independently visualized and interpreted imaging which showed: No acute cardiopulmonary process I agree with the radiologist interpretation   Disposition: Presentation suspicious for laryngitis.  Doubt strep pharyngitis at this time.  Doubt pneumonia at this time.  After consideration of the diagnostic results and the patients response to treatment, I feel that the patient would benefit from Discharge home.  Patient provided with information for the on-call ENT specialist to call and set up a follow-up appoint regarding today's ED visit.  Instructed patient to continue with her prescription medications as prescribed.  Discussed the patient can follow-up with her GI specialist at her upcoming appointment.  Supportive care measures and strict return precautions discussed with patient at bedside. Pt acknowledges and verbalizes understanding. Pt appears safe for discharge. Follow up as indicated in discharge paperwork.    This chart  was dictated using voice recognition software, Dragon. Despite the best efforts of this provider to proofread and correct errors, errors may still occur which can change documentation meaning.  Final Clinical Impression(s) / ED Diagnoses Final diagnoses:  Sore throat  Laryngitis    Rx / DC Orders ED Discharge Orders     None         Hermann Dottavio A, PA-C 10/24/21 1222    Fredia Sorrow, MD 11/06/21 [phone]

## 2021-10-23 NOTE — Discharge Instructions (Signed)
It was a pleasure take care of you today!  Your strep swab and chest x-ray were negative today in the emergency department.  You may continue taking the prescription antibiotic and omeprazole as prescribed.  Ensure to maintain fluid intake with tea, water, broth, soup, Pedialyte, Gatorade.  Attached is information for the on-call ENT specialist, call and set up a follow-up appoint regarding today's ED visit.  Maintain your scheduled follow-up appointment with a GI specialist.  Return to the emergency department if you are experiencing increasing/worsening trouble breathing, trouble swallowing, fever, worsening symptoms.

## 2021-10-23 NOTE — ED Triage Notes (Signed)
Patient c/o sore throat for a few days after blowing up pool floats. Patient states she is having "rales" in her lungs as well. Patient seen by urgent care that gave her steroids and antibiotics, and omeprozole but has not helped

## 2021-11-11 ENCOUNTER — Ambulatory Visit: Payer: 59 | Admitting: Nurse Practitioner

## 2021-12-31 ENCOUNTER — Encounter: Payer: Self-pay | Admitting: *Deleted

## 2022-01-03 ENCOUNTER — Encounter: Payer: Self-pay | Admitting: Nurse Practitioner

## 2022-01-03 ENCOUNTER — Ambulatory Visit (INDEPENDENT_AMBULATORY_CARE_PROVIDER_SITE_OTHER): Payer: 59 | Admitting: Nurse Practitioner

## 2022-01-03 VITALS — BP 110/70 | HR 76 | Ht 67.0 in | Wt 177.6 lb

## 2022-01-03 DIAGNOSIS — K219 Gastro-esophageal reflux disease without esophagitis: Secondary | ICD-10-CM | POA: Diagnosis not present

## 2022-01-03 MED ORDER — OMEPRAZOLE 40 MG PO CPDR: 40.0000 mg | DELAYED_RELEASE_CAPSULE | Freq: Two times a day (BID) | ORAL | 0 refills | Status: DC

## 2022-01-03 NOTE — Patient Instructions (Signed)
We have sent the following medications to your pharmacy for you to pick up at your convenience: Omeprazole 40 mg twice daily  Please follow up with Carl Best, NP in 3 months, sooner if needed.  We have given you information on GERD to look over and follow.  _______________________________________________________  If you are age 60 or older, your body mass index should be between 23-30. Your Body mass index is 27.82 kg/m. If this is out of the aforementioned range listed, please consider follow up with your Primary Care Provider.  If you are age 39 or younger, your body mass index should be between 19-25. Your Body mass index is 27.82 kg/m. If this is out of the aformentioned range listed, please consider follow up with your Primary Care Provider.   ________________________________________________________  The [facility] providers would like to encourage you to use [facility] to communicate with providers for non-urgent requests or questions.  Due to long hold times on the telephone, sending your provider a message by [facility] may be a faster and more efficient way to get a response.  Please allow 48 business hours for a response.  Please remember that this is for non-urgent requests.  _______________________________________________________  Due to recent changes in healthcare laws, you may see the results of your imaging and laboratory studies on MyChart before your provider has had a chance to review them.  We understand that in some cases there may be results that are confusing or concerning to you. Not all laboratory results come back in the same time frame and the provider may be waiting for multiple results in order to interpret others.  Please give [place] 48 hours in order for your provider to thoroughly review all the results before contacting the office for clarification of your results.

## 2022-01-03 NOTE — Progress Notes (Signed)
01/03/2022 Anna Mills [id number] [birth year]   CHIEF COMPLAINT:  Possible reflux, hairy feeling in throat  HISTORY OF PRESENT ILLNESS: Anna Mills is a 61 year old female with a past medical history of hypertension, hypothyroidism, diverticulosis and colon polyps. 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  She presents today for further evaluation regarding possible reflux symptoms.  She reported having a sore throat and voice hoarseness which started after she blew up a float on 10/20/2021.  She also describes feeling like she has a hairy throat/esophagus for the past few years. She was seen by her allergist 10/22/2021 and she received a prescription for Amoxicillin, Omeprazole 40 mg daily and she received a steroid shot. She presented to [facility] ED 10/23/2021 with throat soreness and she felt as if it was difficult to breathe at times.  Her clinical status was stable and she was discharged home with instructions to follow-up with ENT.  She was seen by Pietro Cassis ENT PA-C at [facility] on 12/17/2021 and laryngoscopy exam showed mild interarytenoid edema and erythema thought to be due to laryngeal reflux.  She was instructed to continue Omeprazole 40 mg twice daily and diet modifications were discussed.  She denies having any dysphagia or heartburn.  She continues to feel like back of her mouth and throat are hairy.  She continues to have mild voice hoarseness.  She stated her symptoms are about 80% better since she started Omeprazole 40 mg twice daily.  Rare NSAID use.  She is intentionally attempting to lose weight.  She has 3 cats at home.  She denies ever undergoing an EGD.  She is passing normal brown bowel movement most days.  No rectal bleeding or black stools.  She underwent a screening colonoscopy 11/20/2017 which identified 1 sessile serrated polyp removed from the colon.    Colonoscopy by Dr. Ardis Hughs 11/20/2017: - One 7 mm  polyp in the transverse colon, removed with a cold snare. Resected and retrieved. - The examination was otherwise normal on direct and retroflexion views. - 5 year colonoscopy recall SESSILE SERRATED POLYP WITHOUT CYTOLOGIC DYSPLASIA.   Past Medical History:  Diagnosis Date   Appendicitis    Diverticulosis    Hiatal hernia    Hypertension    Insomnia    Plantar fasciitis    Thyroid disease    Varicose veins    Past Surgical History:  Procedure Laterality Date   LAPAROSCOPIC APPENDECTOMY N/A 12/21/2020   Procedure: APPENDECTOMY LAPAROSCOPIC,LAPAROSCOPIC COLON REPAIR;  Surgeon: Michael Boston, MD;  Location: [facility];  Service: General;  Laterality: N/A;   LASER ABLATION Bilateral    Social History: Non-smoker.  No alcohol use.  No drug use.  Family History: Father with history of peripheral vascular disease.  Mother with history of cancer.  No known family history of esophageal, gastric or colon cancer.  Allergies  Allergen Reactions   Codeine Other (See Comments)    headache   Valcyte [Valganciclovir Hcl] Swelling    *Valtrex      Outpatient Encounter Medications as of 01/03/2022  Medication Sig   levothyroxine (SYNTHROID, LEVOTHROID) 112 MCG tablet Take 112 mcg by mouth daily before breakfast.   lisinopril (PRINIVIL,ZESTRIL) 10 MG tablet    zolpidem (AMBIEN) 10 MG tablet Take 10 mg by mouth at bedtime as needed.   Facility-Administered Encounter Medications as of 01/03/2022  Medication   0.9 %  sodium chloride infusion  REVIEW OF SYSTEMS: Gen: + Attempting to lose weight.  CV: Denies chest pain, palpitations or edema. Resp: + Cough. No SOB or hemoptysis.  GI: See HPI. GU : Denies urinary burning, blood in urine, increased urinary frequency or incontinence. MS: Denies joint pain, muscles aches or weakness. Derm: Denies rash, itchiness, skin lesions or unhealing ulcers. Psych: Denies depression, anxiety or memory loss. Heme: Denies bruising, easy bleeding. Neuro:   Denies headaches, dizziness or paresthesias. Endo:  Denies any problems with DM, thyroid or adrenal function.  PHYSICAL EXAM: BP 110/70   Pulse 76   Ht '5\' 7"'$  (1.702 m)   Wt 177 lb 9.6 oz (80.6 kg)   BMI 27.82 kg/m   General: 61 year old female in no acute distress. Head: Normocephalic and atraumatic. Eyes:  Sclerae non-icteric, conjunctive pink. Ears: Normal auditory acuity. Mouth: Dentition intact. No ulcers or lesions.  Postnasal drainage.  No thrush. Neck: Supple, no lymphadenopathy or thyromegaly.  Lungs: Clear bilaterally to auscultation without wheezes, crackles or rhonchi. Heart: Regular rate and rhythm. No murmur, rub or gallop appreciated.  Abdomen: Soft, nontender, non distended. No masses. No hepatosplenomegaly. Normoactive bowel sounds x 4 quadrants.  Rectal: Deferred. Musculoskeletal: Symmetrical with no gross deformities. Skin: Warm and dry. No rash or lesions on visible extremities. Extremities: No edema. Neurological: Alert oriented x 4, no focal deficits.  Psychological:  Alert and cooperative. Normal mood and affect.  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 twice daily for the next 4 to 6 weeks -EGD to rule out GERD/Barrett's esophagus,  benefits and risks discussed including risk with sedation, risk of bleeding, perforation and infection -Patient does not wish to proceed with an EGD at this time -Maintain GERD diet -Follow-up in office in 3 months  2) History of a sessile serrated colon polyp per colonoscopy 11/2017 -Next colonoscopy due 11/2023       CC:  Marda Stalker, PA-C

## 2022-01-05 ENCOUNTER — Telehealth: Payer: Self-pay | Admitting: Nurse Practitioner

## 2022-01-05 NOTE — Telephone Encounter (Signed)
Remo Lipps, refer to office visit 01/03/2022, pls schedule the patient for an EGD with next available physician. Patient is known by Dr. Ardis Hughs. Patient is a Therapist, sports in [employer]. She prefers to have Dr. Carlean Purl, Dr. Henrene Pastor or Dr. Silverio Decamp. Pls see which physician has the next availability to do her EGD and pls check with that physician to make sure they agree with that plan. THX.

## 2022-01-05 NOTE — Telephone Encounter (Signed)
Patient would like to proceed with EGD. She is a Dr. Eugenia Mills patient but would like to see Dr. Carlean Purl, Dr. Henrene Pastor, or Dr. Silverio Decamp.

## 2022-01-07 NOTE — Telephone Encounter (Signed)
Please see note below and advise if you you can perform her EGD:

## 2022-01-11 ENCOUNTER — Other Ambulatory Visit: Payer: Self-pay

## 2022-01-11 DIAGNOSIS — K219 Gastro-esophageal reflux disease without esophagitis: Secondary | ICD-10-CM

## 2022-01-11 NOTE — Telephone Encounter (Signed)
Pt was scheduled for an EGD with Dr. Carlean Purl on 01/14/2022 at 10:00 AM in the [facility]: Pt made aware: Pt was sent Prep Instructions via my chart: pt made aware Ambulatory referral to GI placed in Epic:  Pt verbalized understanding with all questions answered.

## 2022-01-11 NOTE — Telephone Encounter (Signed)
Left message for pt to call back  °

## 2022-01-11 NOTE — Telephone Encounter (Signed)
Inbound call from patient returning call. 
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
## 2022-01-14 ENCOUNTER — Ambulatory Visit (AMBULATORY_SURGERY_CENTER): Payer: 59 | Admitting: Internal Medicine

## 2022-01-14 ENCOUNTER — Encounter: Payer: Self-pay | Admitting: Internal Medicine

## 2022-01-14 VITALS — BP 131/68 | HR 45 | Temp 96.9°F | Resp 12 | Ht 67.0 in | Wt 177.0 lb

## 2022-01-14 DIAGNOSIS — K219 Gastro-esophageal reflux disease without esophagitis: Secondary | ICD-10-CM | POA: Diagnosis present

## 2022-01-14 DIAGNOSIS — K449 Diaphragmatic hernia without obstruction or gangrene: Secondary | ICD-10-CM

## 2022-01-14 MED ORDER — SODIUM CHLORIDE 0.9 % IV SOLN: 500.0000 mL | INTRAVENOUS | Status: DC

## 2022-01-14 NOTE — Progress Notes (Signed)
Pt's states no medical or surgical changes since previsit or office visit. 

## 2022-01-14 NOTE — Op Note (Addendum)
[facility] [place] Patient Name: Anna Mills Procedure Date: 01/14/2022 10:02 AM MRN: [id number] Endoscopist: Gatha Mayer , MD Age: 61 Referring MD:  Date of Birth: 05/28/1960 Gender: Female Account #: [id number] Procedure:                Upper GI endoscopy Indications:              Exclusion of esophageal reflux Medicines:                Monitored Anesthesia Care Procedure:                Pre-Anesthesia Assessment:                           - Prior to the procedure, a History and Physical                            was performed, and patient medications and                            allergies were reviewed. The patient's tolerance of                            previous anesthesia was also reviewed. The risks                            and benefits of the procedure and the sedation                            options and risks were discussed with the patient.                            All questions were answered, and informed consent                            was obtained. Prior Anticoagulants: The patient has                            taken no previous anticoagulant or antiplatelet                            agents. ASA Grade Assessment: II - A patient with                            mild systemic disease. After reviewing the risks                            and benefits, the patient was deemed in                            satisfactory condition to undergo the procedure.                           After obtaining informed consent, the endoscope was  passed under direct vision. Throughout the                            procedure, the patient's blood pressure, pulse, and                            oxygen saturations were monitored continuously. The                            GIF [id number] #[id number] was introduced through the                            mouth, and advanced to the second part of duodenum.                            The upper GI endoscopy was  accomplished without                            difficulty. The patient tolerated the procedure                            well. Scope In: Scope Out: Findings:                 The larynx was normal.                           The examined esophagus was normal.                           The gastroesophageal flap valve was visualized                            endoscopically and classified as Hill Grade IV (no                            fold, wide open lumen, hiatal hernia present).                           A 2 cm hiatal hernia was present.                           The exam was otherwise without abnormality.                           The cardia and gastric fundus were normal on                            retroflexion. Complications:            No immediate complications. Estimated Blood Loss:     Estimated blood loss: none. Impression:               - Normal larynx. (I think) - had ionterarytenoid                            erytjhema  ENT eval early August - defer to them for                            definitive findings                           - Normal esophagus.                           - Gastroesophageal flap valve classified as Hill                            Grade IV (no fold, wide open lumen, hiatal hernia                            present).                           - 2 cm hiatal hernia.                           - The examination was otherwise normal.                           - No specimens collected. Recommendation:           - Patient has a contact number available for                            emergencies. The signs and symptoms of potential                            delayed complications were discussed with the                            patient. Return to normal activities tomorrow.                            Written discharge instructions were provided to the                            patient.                           - Resume previous diet.                            - Continue present medications.                           - Would stay on high-dose bid PPI for 2-3 months                            and if not all better try allergy evaluation                            CLARIFICATION - has  been on x 3 months and 80%                            better so she will make allergy appointment herself                            - can try to reduce to qd PPI also Gatha Mayer, MD 01/14/2022 10:40:53 AM This report has been signed electronically.

## 2022-01-14 NOTE — Progress Notes (Signed)
Report given to PACU, vss 

## 2022-01-14 NOTE — Patient Instructions (Addendum)
You have a 2 cm hiatal hernia - very small. Your problems could be from reflux though I think it is not certain.  I usually have people take the PPI (omeprazole) twice a day for 2-3 months and see where things are. Do that and then let me know. If persistent symptoms then would consider allergy referral.  Nothing bad has been found!  I appreciate the opportunity to care for you. Gatha Mayer, MD, FACG  YOU HAD AN ENDOSCOPIC PROCEDURE TODAY AT [facility]:   Refer to the procedure report that was given to you for any specific questions about what was found during the examination.  If the procedure report does not answer your questions, please call your gastroenterologist to clarify.  If you requested that your care partner not be given the details of your procedure findings, then the procedure report has been included in a sealed envelope for you to review at your convenience later.  YOU SHOULD EXPECT: Some feelings of bloating in the abdomen. Passage of more gas than usual.  Walking can help get rid of the air that was put into your GI tract during the procedure and reduce the bloating. If you had a lower endoscopy (such as a colonoscopy or flexible sigmoidoscopy) you may notice spotting of blood in your stool or on the toilet paper. If you underwent a bowel prep for your procedure, you may not have a normal bowel movement for a few days.  Please Note:  You might notice some irritation and congestion in your nose or some drainage.  This is from the oxygen used during your procedure.  There is no need for concern and it should clear up in a day or so.  SYMPTOMS TO REPORT IMMEDIATELY:  Following lower endoscopy (colonoscopy or flexible sigmoidoscopy):  Excessive amounts of blood in the stool  Significant tenderness or worsening of abdominal pains  Swelling of the abdomen that is new, acute  Fever of 100F or higher  Following upper endoscopy (EGD)  Vomiting of blood or  coffee ground material  New chest pain or pain under the shoulder blades  Painful or persistently difficult swallowing  New shortness of breath  Fever of 100F or higher  Black, tarry-looking stools  For urgent or emergent issues, a gastroenterologist can be reached at any hour by calling [phone]. Do not use MyChart messaging for urgent concerns.    DIET:  We do recommend a small meal at first, but then you may proceed to your regular diet.  Drink plenty of fluids but you should avoid alcoholic beverages for 24 hours.  ACTIVITY:  You should plan to take it easy for the rest of today and you should NOT DRIVE or use heavy machinery until tomorrow (because of the sedation medicines used during the test).    FOLLOW UP: Our staff will call the number listed on your records the next business day following your procedure.  We will call around 7:15- 8:00 am to check on you and address any questions or concerns that you may have regarding the information given to you following your procedure. If we do not reach you, we will leave a message.  If you develop any symptoms (ie: fever, flu-like symptoms, shortness of breath, cough etc.) before then, please call [phone].  If you test positive for Covid 19 in the 2 weeks post procedure, please call and report this information to [place].    If any biopsies were taken you will be  contacted by phone or by letter within the next 1-3 weeks.  Please call us at [phone] if you have not heard about the biopsies in 3 weeks.    SIGNATURES/CONFIDENTIALITY: You and/or your care partner have signed paperwork which will be entered into your electronic medical record.  These signatures attest to the fact that that the information above on your After Visit Summary has been reviewed and is understood.  Full responsibility of the confidentiality of this discharge information lies with you and/or your care-partner.

## 2022-01-14 NOTE — Progress Notes (Signed)
1015 Robinul 0.1 mg IV given due large amount of secretions upon assessment.  MD made aware, vss 

## 2022-01-17 ENCOUNTER — Telehealth: Payer: Self-pay

## 2022-01-17 NOTE — Telephone Encounter (Signed)
Left message on follow up call. 

## 2022-04-25 ENCOUNTER — Other Ambulatory Visit: Payer: Self-pay | Admitting: Obstetrics and Gynecology

## 2022-04-25 DIAGNOSIS — R928 Other abnormal and inconclusive findings on diagnostic imaging of breast: Secondary | ICD-10-CM

## 2022-05-10 ENCOUNTER — Other Ambulatory Visit: Payer: Self-pay | Admitting: Family Medicine

## 2022-05-10 DIAGNOSIS — D171 Benign lipomatous neoplasm of skin and subcutaneous tissue of trunk: Secondary | ICD-10-CM

## 2022-05-11 ENCOUNTER — Ambulatory Visit
Admission: RE | Admit: 2022-05-11 | Discharge: 2022-05-11 | Disposition: A | Discharge status: Home / Self Care | Payer: 59 | Source: Ambulatory Visit | Attending: Obstetrics and Gynecology | Admitting: Obstetrics and Gynecology

## 2022-05-11 DIAGNOSIS — R928 Other abnormal and inconclusive findings on diagnostic imaging of breast: Secondary | ICD-10-CM

## 2023-03-21 ENCOUNTER — Emergency Department (HOSPITAL_COMMUNITY)
Admission: EM | Admit: 2023-03-21 | Discharge: 2023-03-21 | Disposition: A | Discharge status: Home / Self Care | Payer: 59 | Attending: Emergency Medicine | Admitting: Emergency Medicine

## 2023-03-21 ENCOUNTER — Telehealth: Payer: Self-pay | Admitting: Internal Medicine

## 2023-03-21 ENCOUNTER — Emergency Department (HOSPITAL_COMMUNITY): Payer: 59

## 2023-03-21 ENCOUNTER — Encounter (HOSPITAL_COMMUNITY): Payer: Self-pay

## 2023-03-21 ENCOUNTER — Other Ambulatory Visit: Payer: Self-pay

## 2023-03-21 DIAGNOSIS — I10 Essential (primary) hypertension: Secondary | ICD-10-CM | POA: Insufficient documentation

## 2023-03-21 DIAGNOSIS — Z79899 Other long term (current) drug therapy: Secondary | ICD-10-CM | POA: Insufficient documentation

## 2023-03-21 DIAGNOSIS — R112 Nausea with vomiting, unspecified: Secondary | ICD-10-CM

## 2023-03-21 DIAGNOSIS — R1084 Generalized abdominal pain: Secondary | ICD-10-CM | POA: Diagnosis not present

## 2023-03-21 DIAGNOSIS — R109 Unspecified abdominal pain: Secondary | ICD-10-CM | POA: Diagnosis present

## 2023-03-21 LAB — CBC WITH DIFFERENTIAL/PLATELET
Abs Immature Granulocytes: 0.04 10*3/uL (ref 0.00–0.07)
Basophils Absolute: 0.1 10*3/uL (ref 0.0–0.1)
Basophils Relative: 1 %
Eosinophils Absolute: 0.1 10*3/uL (ref 0.0–0.5)
Eosinophils Relative: 1 %
HCT: 42.5 % (ref 36.0–46.0)
Hemoglobin: 14.6 g/dL (ref 12.0–15.0)
Immature Granulocytes: 0 %
Lymphocytes Relative: 16 %
Lymphs Abs: 1.6 10*3/uL (ref 0.7–4.0)
MCH: 31.8 pg (ref 26.0–34.0)
MCHC: 34.4 g/dL (ref 30.0–36.0)
MCV: 92.6 fL (ref 80.0–100.0)
Monocytes Absolute: 0.5 10*3/uL (ref 0.1–1.0)
Monocytes Relative: 5 %
Neutro Abs: 7.7 10*3/uL (ref 1.7–7.7)
Neutrophils Relative %: 77 %
Platelets: 317 10*3/uL (ref 150–400)
RBC: 4.59 MIL/uL (ref 3.87–5.11)
RDW: 12.4 % (ref 11.5–15.5)
WBC: 10 10*3/uL (ref 4.0–10.5)
nRBC: 0 % (ref 0.0–0.2)

## 2023-03-21 LAB — COMPREHENSIVE METABOLIC PANEL
ALT: 34 U/L (ref 0–44)
AST: 34 U/L (ref 15–41)
Albumin: 4.7 g/dL (ref 3.5–5.0)
Alkaline Phosphatase: 62 U/L (ref 38–126)
Anion gap: 10 (ref 5–15)
BUN: 17 mg/dL (ref 8–23)
CO2: 26 mmol/L (ref 22–32)
Calcium: 9.3 mg/dL (ref 8.9–10.3)
Chloride: 100 mmol/L (ref 98–111)
Creatinine, Ser: 0.88 mg/dL (ref 0.44–1.00)
GFR, Estimated: >60 mL/min (ref >60)
Glucose, Bld: 137 mg/dL — ABNORMAL HIGH (ref 70–99)
Potassium: 3.8 mmol/L (ref 3.5–5.1)
Sodium: 136 mmol/L (ref 135–145)
Total Bilirubin: 0.5 mg/dL (ref <1.2)
Total Protein: 7.6 g/dL (ref 6.5–8.1)

## 2023-03-21 LAB — URINALYSIS, ROUTINE W REFLEX MICROSCOPIC
Bilirubin Urine: NEGATIVE
Glucose, UA: NEGATIVE mg/dL
Hgb urine dipstick: NEGATIVE
Ketones, ur: 80 mg/dL — AB
Leukocytes,Ua: NEGATIVE
Nitrite: NEGATIVE
Protein, ur: NEGATIVE mg/dL
Specific Gravity, Urine: 1.02 (ref 1.005–1.030)
pH: 5 (ref 5.0–8.0)

## 2023-03-21 LAB — LIPASE, BLOOD: Lipase: 34 U/L (ref 11–51)

## 2023-03-21 LAB — MAGNESIUM: Magnesium: 2.1 mg/dL (ref 1.7–2.4)

## 2023-03-21 MED ORDER — LACTATED RINGERS IV BOLUS
1000.0000 mL | Freq: Once | INTRAVENOUS | Status: AC
  Administered 2023-03-21: 1000 mL via INTRAVENOUS

## 2023-03-21 MED ORDER — KETOROLAC TROMETHAMINE 15 MG/ML IJ SOLN
15.0000 mg | Freq: Once | INTRAMUSCULAR | Status: AC
  Administered 2023-03-21: 15 mg via INTRAVENOUS
  Filled 2023-03-21: qty 1

## 2023-03-21 MED ORDER — ONDANSETRON 4 MG PO TBDP: 4.0000 mg | ORAL_TABLET | Freq: Three times a day (TID) | ORAL | 0 refills | Status: DC | PRN

## 2023-03-21 MED ORDER — HYDROMORPHONE HCL 1 MG/ML IJ SOLN
0.5000 mg | Freq: Once | INTRAMUSCULAR | Status: AC
  Administered 2023-03-21: 0.5 mg via INTRAVENOUS
  Filled 2023-03-21: qty 1

## 2023-03-21 MED ORDER — PROMETHAZINE HCL 25 MG RE SUPP: 25.0000 mg | Freq: Four times a day (QID) | RECTAL | 1 refills | Status: DC | PRN

## 2023-03-21 MED ORDER — FAMOTIDINE 20 MG PO TABS
20.0000 mg | ORAL_TABLET | Freq: Once | ORAL | Status: AC
  Administered 2023-03-21: 20 mg via ORAL
  Filled 2023-03-21: qty 1

## 2023-03-21 MED ORDER — METOCLOPRAMIDE HCL 5 MG/ML IJ SOLN
10.0000 mg | Freq: Once | INTRAMUSCULAR | Status: AC
  Administered 2023-03-21: 10 mg via INTRAVENOUS
  Filled 2023-03-21: qty 2

## 2023-03-21 MED ORDER — OMEPRAZOLE 40 MG PO CPDR: 40.0000 mg | DELAYED_RELEASE_CAPSULE | Freq: Two times a day (BID) | ORAL | 2 refills | Status: DC

## 2023-03-21 MED ORDER — IOHEXOL 300 MG/ML  SOLN
100.0000 mL | Freq: Once | INTRAMUSCULAR | Status: AC | PRN
  Administered 2023-03-21: 100 mL via INTRAVENOUS

## 2023-03-21 MED ORDER — ALUM & MAG HYDROXIDE-SIMETH 200-200-20 MG/5ML PO SUSP
30.0000 mL | Freq: Once | ORAL | Status: AC
  Administered 2023-03-21: 30 mL via ORAL
  Filled 2023-03-21: qty 30

## 2023-03-21 MED ORDER — LIDOCAINE VISCOUS HCL 2 % MT SOLN
15.0000 mL | Freq: Once | OROMUCOSAL | Status: AC
  Administered 2023-03-21: 15 mL via ORAL
  Filled 2023-03-21: qty 15

## 2023-03-21 NOTE — ED Provider Notes (Addendum)
[place] EMERGENCY DEPARTMENT AT [facility] Provider Note   CSN: [id number] Arrival date & time: 03/21/23  0022     History  Chief Complaint  Patient presents with   Abdominal Pain    Anna Mills is a 62 y.o. female.   Abdominal Pain Associated symptoms: nausea and vomiting   Patient presents for abdominal pain and vomiting.  Medical history includes diverticulosis, HTN, hiatal hernia.  Onset of current symptoms was yesterday morning.  She tried taking some Tylenol for the pain but subsequently had vomiting.  She did have a bowel movement today.  She denies any recent bleeding or discharge.  She denies any recent urinary symptoms.  Currently, she has ongoing lower abdominal pain and nausea.     Home Medications Prior to Admission medications   Medication Sig Start Date End Date Taking? Authorizing Provider  levothyroxine (SYNTHROID) 112 MCG tablet Take 112 mcg by mouth daily before breakfast.   Yes [provider]  lisinopril (PRINIVIL,ZESTRIL) 10 MG tablet Take 5 mg by mouth daily. 09/22/17  Yes [provider]  Multiple Vitamin (MULTIVITAMIN WITH MINERALS) TABS tablet Take 1 tablet by mouth daily.   Yes [provider]  pravastatin (PRAVACHOL) 40 MG tablet Take 40 mg by mouth daily. 03/18/23  Yes [provider]  zolpidem (AMBIEN) 10 MG tablet Take 10 mg by mouth at bedtime as needed. 09/05/17  Yes [provider]  omeprazole (PRILOSEC) 40 MG capsule Take 1 capsule (40 mg total) by mouth in the morning and at bedtime. 03/21/23 06/19/23  Gloris Manchester, MD      Allergies    Codeine and Valcyte [valganciclovir hcl]    Review of Systems   Review of Systems  Gastrointestinal:  Positive for abdominal pain, nausea and vomiting.  All other systems reviewed and are negative.   Physical Exam Updated Vital Signs BP (!) 153/84   Pulse 70   Temp 98 F (36.7 C) (Oral)   Resp 18   Ht 5\' 7"  (1.702 m)   Wt 80.3 kg   SpO2  99%   BMI 27.73 kg/m  Physical Exam Vitals and nursing note reviewed.  Constitutional:      General: She is not in acute distress.    Appearance: She is well-developed. She is not ill-appearing, toxic-appearing or diaphoretic.  HENT:     Head: Normocephalic and atraumatic.     Mouth/Throat:     Mouth: Mucous membranes are moist.  Eyes:     General: No scleral icterus.    Conjunctiva/sclera: Conjunctivae normal.  Cardiovascular:     Rate and Rhythm: Normal rate and regular rhythm.  Pulmonary:     Effort: Pulmonary effort is normal. No respiratory distress.  Abdominal:     Palpations: Abdomen is soft.     Tenderness: There is generalized abdominal tenderness. There is no guarding or rebound.  Musculoskeletal:        General: No swelling.     Cervical back: Neck supple.  Skin:    General: Skin is warm and dry.     Capillary Refill: Capillary refill takes less than 2 seconds.  Neurological:     General: No focal deficit present.     Mental Status: She is alert and oriented to person, place, and time.  Psychiatric:        Mood and Affect: Mood normal.        Behavior: Behavior normal.     ED Results / Procedures / Treatments  Labs (all labs ordered are listed, but only abnormal results are displayed) Labs Reviewed  COMPREHENSIVE METABOLIC PANEL - Abnormal; Notable for the following components:      Result Value   Glucose, Bld 137 (*)    All other components within normal limits  URINALYSIS, ROUTINE W REFLEX MICROSCOPIC - Abnormal; Notable for the following components:   Ketones, ur 80 (*)    All other components within normal limits  LIPASE, BLOOD  CBC WITH DIFFERENTIAL/PLATELET  MAGNESIUM    EKG None  Radiology CT ABDOMEN PELVIS W CONTRAST  Result Date: 03/21/2023 CLINICAL DATA:  62 year old female with history of mid lower abdominal pain with nausea and vomiting for 1 day. EXAM: CT ABDOMEN AND PELVIS WITH CONTRAST TECHNIQUE: Multidetector CT imaging of the  abdomen and pelvis was performed using the standard protocol following bolus administration of intravenous contrast. RADIATION DOSE REDUCTION: This exam was performed according to the departmental dose-optimization program which includes automated exposure control, adjustment of the mA and/or kV according to patient size and/or use of iterative reconstruction technique. CONTRAST:  OMNIPAQUE IOHEXOL 300 MG/ML  SOLN COMPARISON:  CT of the abdomen and pelvis 12/20/2020. FINDINGS: Lower chest: Unremarkable. Hepatobiliary: No suspicious cystic or solid hepatic lesions. No intra or extrahepatic biliary ductal dilatation. Gallbladder is unremarkable in appearance. Pancreas: No pancreatic mass. No pancreatic ductal dilatation. No pancreatic or peripancreatic fluid collections or inflammatory changes. Spleen: Unremarkable. Adrenals/Urinary Tract: Bilateral kidneys and adrenal glands are normal in appearance. No hydroureteronephrosis. Urinary bladder is unremarkable in appearance. Stomach/Bowel: Stomach is nearly decompressed. Allowing for this, there does appear to be some mural thickening of the stomach in the region of the distal body and proximal antrum (best appreciated on axial image 24 of series 2), which could indicate gastritis. No pathologic dilatation of small bowel or colon. A few scattered colonic diverticuli are noted, without surrounding inflammatory changes to indicate an acute diverticulitis at this time. Status post appendectomy. Vascular/Lymphatic: Atherosclerotic calcifications are noted in the abdominal aorta and pelvic vasculature. No lymphadenopathy noted in the abdomen or pelvis. Reproductive: Uterus is heterogeneous in appearance with multiple small lesions, presumably fibroids, largest of which measures 2.6 cm in the posterior aspect of the uterine body. Ovaries are atrophic and otherwise unremarkable in appearance. Other: No significant volume of ascites.  No pneumoperitoneum. Musculoskeletal:  There are no aggressive appearing lytic or blastic lesions noted in the visualized portions of the skeleton. IMPRESSION: 1. Subjective mural thickening in the distal body and proximal antrum of the stomach, which could be indicative of gastritis, although infiltrative process such as malignancy could have a similar appearance. Follow-up nonemergent endoscopy should be considered in the near future to better evaluate this finding if clinically appropriate. No other acute findings are noted in the abdomen or pelvis to account for the patient's symptoms. 2. Mild colonic diverticulosis without evidence of acute diverticulitis at this time. 3. Aortic atherosclerosis. 4. Additional incidental findings, as above. Electronically Signed   By: Trudie Reed M.D.   On: 03/21/2023 06:59    Procedures Procedures    Medications Ordered in ED Medications  alum & mag hydroxide-simeth (MAALOX/MYLANTA) 200-200-20 MG/5ML suspension 30 mL (has no administration in time range)    And  lidocaine (XYLOCAINE) 2 % viscous mouth solution 15 mL (has no administration in time range)  famotidine (PEPCID) tablet 20 mg (has no administration in time range)  HYDROmorphone (DILAUDID) injection 0.5 mg (0.5 mg Intravenous Given 03/21/23 0242)  ketorolac (TORADOL) 15 MG/ML injection 15 mg (  15 mg Intravenous Given 03/21/23 0241)  lactated ringers bolus 1,000 mL (0 mLs Intravenous Stopped 03/21/23 0350)  metoCLOPramide (REGLAN) injection 10 mg (10 mg Intravenous Given 03/21/23 0242)  iohexol (OMNIPAQUE) 300 MG/ML solution 100 mL (100 mLs Intravenous Contrast Given 03/21/23 0300)  HYDROmorphone (DILAUDID) injection 0.5 mg (0.5 mg Intravenous Given 03/21/23 0520)    ED Course/ Medical Decision Making/ A&P                                 Medical Decision Making Amount and/or Complexity of Data Reviewed Labs: ordered. Radiology: ordered.  Risk Prescription drug management.  This patient presents to the ED for concern of  abdominal pain, nausea, vomiting, this involves an extensive number of treatment options, and is a complaint that carries with it a high risk of complications and morbidity.  The differential diagnosis includes enteritis, colitis, diverticulitis, SBO, neoplasm   Co morbidities that complicate the patient evaluation  diverticulosis, HTN, hiatal hernia   Additional history obtained:  Additional history obtained from N/A External records from outside source obtained and reviewed including EMR   Lab Tests:  I Ordered, and personally interpreted labs.  The pertinent results include: Normal hemoglobin, no leukocytosis, normal kidney function, normal electrolytes, normal hepatobiliary enzymes, no evidence of UTI   Imaging Studies ordered:  I ordered imaging studies including CT of abdomen pelvis I independently visualized and interpreted imaging which showed mural thickening of distal body of proximal antral stomach concerning for gastritis and/or infiltrative process. I agree with the radiologist interpretation   Cardiac Monitoring: / EKG:  The patient was maintained on a cardiac monitor.  I personally viewed and interpreted the cardiac monitored which showed an underlying rhythm of: Sinus rhythm  Problem List / ED Course / Critical interventions / Medication management  Patient presents for 1 day of abdominal pain, nausea, and vomiting.  Vital signs on arrival are normal.  Lab work, obtained prior to patient being bedded in the ED, is unremarkable.  On exam, patient appears mildly uncomfortable.  She does have lower abdominal tenderness.  She endorses ongoing nausea.  Dilaudid, Toradol, and Reglan were ordered for symptomatic relief.  IV fluids were ordered given her recent p.o. intolerance and fluid losses.  CT scan showed mural thickening of stomach.  GI cocktail was ordered.  She did have improved symptoms while in the ED.  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  She  does have access to GI follow-up.  She was advised to follow-up for consideration of EGD.  She was prescribed PPI.  She was discharged in stable condition. I ordered medication including Dilaudid and Toradol for analgesia; Reglan for nausea; IV fluids for hydration; GI cocktail for gastritis Reevaluation of the patient after these medicines showed that the patient improved I have reviewed the patients home medicines and have made adjustments as needed   Social Determinants of Health:  Has PCP         Final Clinical Impression(s) / ED Diagnoses Final diagnoses:  Nausea and vomiting, unspecified vomiting type  Generalized abdominal pain    Rx / DC Orders ED Discharge Orders          Ordered    omeprazole (PRILOSEC) 40 MG capsule  2 times daily        03/21/23 0726              Gloris Manchester, MD 03/21/23 [id number]    Durwin Nora,  Alycia Rossetti, MD 03/21/23 1610

## 2023-03-21 NOTE — Telephone Encounter (Signed)
Seen in ED overnight due to intractable nausea and vomiting  Neg w/u - decompressed stomach w/ ? Wall thickening  She ist still nauseous  Sudden onset no fever or othe GI sxs  Needs antiemetics  Meds ordered this encounter  Medications   ondansetron (ZOFRAN-ODT) 4 MG disintegrating tablet    Sig: Take 1 tablet (4 mg total) by mouth every 8 (eight) hours as needed for nausea or vomiting.    Dispense:  20 tablet    Refill:  0   promethazine (PHENERGAN) 25 MG suppository    Sig: Place 1 suppository (25 mg total) rectally every 6 (six) hours as needed for nausea or vomiting.    Dispense:  6 each    Refill:  1   She will see me 11/14

## 2023-03-21 NOTE — ED Triage Notes (Signed)
Pt reports with mid lower abdominal pain and vomiting since Monday morning.

## 2023-03-21 NOTE — Discharge Instructions (Signed)
Your CT scan showed some thickening of your stomach wall.  This is likely inflammation and possible ulceration, which would explain your symptoms yesterday.  You can treat this by restarting your Prilosec and using over-the-counter Maalox as needed.  New prescription for Prilosec was sent to your pharmacy.  Talk to Dr. Concha Se about getting a EGD for direct visualization of this area.  Avoid NSAIDs, alcohol, and tobacco.  Return to the emergency department for any new or worsening symptoms of concern.

## 2023-03-23 ENCOUNTER — Ambulatory Visit: Payer: 59 | Admitting: Internal Medicine

## 2023-03-23 ENCOUNTER — Encounter: Payer: Self-pay | Admitting: Internal Medicine

## 2023-03-23 VITALS — BP 110/68 | HR 71 | Ht 67.0 in | Wt 172.0 lb

## 2023-03-23 DIAGNOSIS — R1011 Right upper quadrant pain: Secondary | ICD-10-CM

## 2023-03-23 DIAGNOSIS — K2970 Gastritis, unspecified, without bleeding: Secondary | ICD-10-CM

## 2023-03-23 DIAGNOSIS — R112 Nausea with vomiting, unspecified: Secondary | ICD-10-CM | POA: Diagnosis not present

## 2023-03-23 DIAGNOSIS — F439 Reaction to severe stress, unspecified: Secondary | ICD-10-CM

## 2023-03-23 DIAGNOSIS — K2990 Gastroduodenitis, unspecified, without bleeding: Secondary | ICD-10-CM

## 2023-03-23 NOTE — Progress Notes (Signed)
Chief Complaint: nausea and vomiting  HPI: Patient is a 62 old female patient with history of diverticulosis, hypertension, and Hiatal Hernia who is following up after recent ED visit.  On 03/21/23 ED visit: Patient was seen in ED for complaints of abdominal pain and vomiting.  Lab work was unremarkable.  CT scan showed mural thickening of stomach.  GI cocktail was ordered.  She was advised to follow-up for consideration of EGD.  She was prescribed PPI. 03/21/23 Labs show normal LFTs.  WBC 10.  Hemoglobin 14.6.  Lipase 34. Discharged with Maalox and Prilosec 40mg  BID.  Patient presents today to follow-up on her recent ED visit. She states symptoms started on morning of 03/20/2023 where she was experiencing RUQ abdominal pain with vomiting leading her to go to ED. She was given a few medications (GI cocktail,Dilaudid, Reglan) in ED which she reports helped decrease the severity.    She contacted the office later that day for persistent nausea and Dr. Leone Payor prescribed Ondansetron which she took for the first day and a half to prevent throwing up her medications. The ED also sent her home with Maalox and Prilosec 40mg  BID which she took for a total of three doses then discontinued until she saw [place] in the office. She did state the Omeprazole did help lessen the abdominal pain.   Today she reports she is feeling much better. The RUQ pain has almost completely resolved, mild discomfort with palpation. No vomiting in over 24 hours. She uses Ondansetron as needed. She has some hoarseness. Denies nausea, vomiting, fever or chills Denies GERD or dysphagia. Denies bloating. Denies diarrhea or constipation. Denies blood in stool. No alcohol or tobacco use. No family hx of CA No travel or exposure.  She does admit to increased stress recently with her 61 year old daughter with hx of breast Ca who was recently diagnosed with leptomeningeal tumors. She's traveling to [place] for second opinion for treatment  options.      Past Procedures: 01/14/22 EGD - Normal larynx. ( I think) - had interarytenoid erythema ENT eval early August - defer to them for definitive findings  - Normal esophagus.  - Gastroesophageal flap valve classified as Hill Grade IV ( no fold, wide open lumen, hiatal hernia present) .  - 2 cm hiatal hernia.  - The examination was otherwise normal.  - No specimens collected.  11/20/2017 colonoscopy  - One 7 mm polyp in the transverse colon, removed with a cold snare. Resected and retrieved.  - The examination was otherwise normal on direct and retroflexion views. Path: Polyp was a typical sessile serrated polyp.  She needs recall colonosocpy in 5 years.     Past imaging: 03/21/2023 CT abdomen pelvis with contrast  IMPRESSION: 1. Subjective mural thickening in the distal body and proximal antrum of the stomach, which could be indicative of gastritis, although infiltrative process such as malignancy could have a similar appearance. Follow-up nonemergent endoscopy should be considered in the near future to better evaluate this finding if clinically appropriate. No other acute findings are noted in the abdomen or pelvis to account for the patient's symptoms. 2. Mild colonic diverticulosis without evidence of acute diverticulitis at this time. 3. Aortic atherosclerosis. 4. Additional incidental findings, as above.  Past Medical History:  Diagnosis Date   Appendicitis    Diverticulosis    Hiatal hernia    Hypertension    Insomnia    Laryngopharyngeal reflux (LPR)    Plantar fasciitis    Thyroid  disease    Varicose veins     Past Surgical History:  Procedure Laterality Date   LAPAROSCOPIC APPENDECTOMY N/A 12/21/2020   Procedure: APPENDECTOMY LAPAROSCOPIC,LAPAROSCOPIC COLON REPAIR;  Surgeon: Karie Soda, MD;  Location: [facility] OR[facility];  Service: General;  Laterality: N/A;   LASER ABLATION Bilateral     Current Outpatient Medications  Medication Sig Dispense Refill    levothyroxine (SYNTHROID) 112 MCG tablet Take 112 mcg by mouth daily before breakfast.     lisinopril (PRINIVIL,ZESTRIL) 10 MG tablet Take 5 mg by mouth daily.     Multiple Vitamin (MULTIVITAMIN WITH MINERALS) TABS tablet Take 1 tablet by mouth daily.     pravastatin (PRAVACHOL) 40 MG tablet Take 40 mg by mouth daily.     zolpidem (AMBIEN) 10 MG tablet Take 10 mg by mouth at bedtime as needed.     omeprazole (PRILOSEC) 40 MG capsule Take 1 capsule (40 mg total) by mouth in the morning and at bedtime. (Patient not taking: Reported on 03/23/2023) 30 capsule 2   ondansetron (ZOFRAN-ODT) 4 MG disintegrating tablet Take 1 tablet (4 mg total) by mouth every 8 (eight) hours as needed for nausea or vomiting. (Patient not taking: Reported on 03/23/2023) 20 tablet 0   No current facility-administered medications for this visit.    Allergies as of 03/23/2023 - Review Complete 03/23/2023  Allergen Reaction Noted   Codeine Other (See Comments) 10/23/2021   Valcyte [valganciclovir hcl] Swelling 01/13/2015    Family History  Problem Relation Age of Onset   Breast cancer Mother 31   Cancer Mother        breast   Peripheral vascular disease Father    Breast cancer Daughter 39   Colon cancer Neg Hx    Esophageal cancer Neg Hx    Stomach cancer Neg Hx    Rectal cancer Neg Hx     Social History   Socioeconomic History   Marital status: Married    Spouse name: Not on file   Number of children: Not on file   Years of education: Not on file   Highest education level: Not on file  Occupational History   Not on file  Tobacco Use   Smoking status: Never   Smokeless tobacco: Never  Vaping Use   Vaping status: Never Used  Substance and Sexual Activity   Alcohol use: No    Alcohol/week: 0.0 standard drinks of alcohol   Drug use: No   Sexual activity: Not on file  Other Topics Concern   Not on file  Social History Narrative   Not on file   Social Determinants of Health   Financial Resource  Strain: Not on file  Food Insecurity: Not on file  Transportation Needs: Not on file  Physical Activity: Not on file  Stress: Not on file  Social Connections: Not on file  Intimate Partner Violence: Not on file    Review of Systems:    Constitutional: No weight loss, fever, chills, weakness or fatigue HEENT: Eyes: No change in vision               Ears, Nose, Throat:  No change in hearing or congestion Skin: No rash or itching Cardiovascular: No chest pain, chest pressure or palpitations   Respiratory: No SOB or cough Gastrointestinal: See HPI and otherwise negative Genitourinary: No dysuria or change in urinary frequency Neurological: No headache, dizziness or syncope Musculoskeletal: No new muscle or joint pain Hematologic: No bleeding or bruising Psychiatric: No history of depression or  anxiety    Physical Exam:  Vital signs: BP 110/68   Pulse 71   Ht 5\' 7"  (1.702 m)   Wt 172 lb (78 kg)   SpO2 99%   BMI 26.94 kg/m   Constitutional:   Pleasant Caucasian female appears to be in NAD, Well developed, Well nourished, alert and cooperative Neck:  Supple Throat: Oral cavity and pharynx without inflammation, swelling or lesion.  Respiratory: Respirations even and unlabored. Lungs clear to auscultation bilaterally.   No wheezes, crackles, or rhonchi.  Cardiovascular: Normal S1, S2. No MRG. Regular rate and rhythm. No peripheral edema, cyanosis or pallor.  Gastrointestinal:  Soft, nondistended, nontender. No rebound or guarding. Normal bowel sounds. No appreciable masses or hepatomegaly. Rectal:  Not performed.  Psychiatric: Oriented to person, place and time. Demonstrates good judgement and reason without abnormal affect or behaviors. Expresses stress with her daughters prognosis.  RELEVANT LABS AND IMAGING: CBC    Component Value Date/Time   WBC 10.0 03/21/2023 0037   RBC 4.59 03/21/2023 0037   HGB 14.6 03/21/2023 0037   HCT 42.5 03/21/2023 0037   PLT 317 03/21/2023 0037    MCV 92.6 03/21/2023 0037   MCH 31.8 03/21/2023 0037   MCHC 34.4 03/21/2023 0037   RDW 12.4 03/21/2023 0037   LYMPHSABS 1.6 03/21/2023 0037   MONOABS 0.5 03/21/2023 0037   EOSABS 0.1 03/21/2023 0037   BASOSABS 0.1 03/21/2023 0037    CMP     Component Value Date/Time   NA 136 03/21/2023 0037   K 3.8 03/21/2023 0037   CL 100 03/21/2023 0037   CO2 26 03/21/2023 0037   GLUCOSE 137 (H) 03/21/2023 0037   BUN 17 03/21/2023 0037   CREATININE 0.88 03/21/2023 0037   CALCIUM 9.3 03/21/2023 0037   PROT 7.6 03/21/2023 0037   ALBUMIN 4.7 03/21/2023 0037   AST 34 03/21/2023 0037   ALT 34 03/21/2023 0037   ALKPHOS 62 03/21/2023 0037   BILITOT 0.5 03/21/2023 0037   GFRNONAA >60 03/21/2023 0037    Assessment: Nausea and Vomiting RUQ pain Gastritis Situational stress  62 year old female patient that presents after acute episode of abdominal pain, nausea, and vomiting. Her lab work and imaging is reassuring that there are no acute causes of her symptoms. She has been under large amount of stress with her daughters recent prognosis. None the less she has experienced improvement with the PPI therapy for the inflammation (gastritis) suspected on CT imaging therefore we will continue for an additional 4 weeks and then discontinue. She can use antiemetics prn. No need for EGD - had one 2023 and w/ sudden acute onset of symptoms and rapid resolution, favor underdistention vs spasm vs gastritis.  Plan: -Continue Omeprazole 20mg  po daily for 4 weeks -Ondansetron prn -RTC if symptoms persist or worsen   Deanna May, FNP-C [facility] 03/23/2023, 4:33 PM  Cc: Jarrett Soho, PA-C   Patient seen w/ NP who acted as scribe for visit. Iva Boop, MD, [facility]

## 2023-03-23 NOTE — Patient Instructions (Signed)
Take over the counter Prilosec 20mg  once daily 30 minutes before breakfast for a month and then stop.  Advance diet as tolerated.   _______________________________________________________  If your blood pressure at your visit was 140/90 or greater, please contact your primary care physician to follow up on this.  _______________________________________________________  If you are age 61 or older, your body mass index should be between 23-30. Your Body mass index is 26.94 kg/m. If this is out of the aforementioned range listed, please consider follow up with your Primary Care Provider.  If you are age 35 or younger, your body mass index should be between 19-25. Your Body mass index is 26.94 kg/m. If this is out of the aformentioned range listed, please consider follow up with your Primary Care Provider.   ________________________________________________________  The  GI providers would like to encourage you to use [facility] to communicate with providers for non-urgent requests or questions.  Due to long hold times on the telephone, sending your provider a message by [facility] may be a faster and more efficient way to get a response.  Please allow 48 business hours for a response.  Please remember that this is for non-urgent requests.  _______________________________________________________  I appreciate the opportunity to care for you. Stan Head, MD, [facility]

## 2023-05-15 ENCOUNTER — Other Ambulatory Visit: Payer: Self-pay | Admitting: Obstetrics and Gynecology

## 2023-05-15 DIAGNOSIS — Z803 Family history of malignant neoplasm of breast: Secondary | ICD-10-CM

## 2023-05-19 ENCOUNTER — Ambulatory Visit
Admission: RE | Admit: 2023-05-19 | Discharge: 2023-05-19 | Disposition: A | Discharge status: Home / Self Care | Payer: 59 | Source: Ambulatory Visit | Attending: Obstetrics and Gynecology | Admitting: Obstetrics and Gynecology

## 2023-05-19 DIAGNOSIS — Z803 Family history of malignant neoplasm of breast: Secondary | ICD-10-CM

## 2023-05-19 MED ORDER — GADOPICLENOL 0.5 MMOL/ML IV SOLN
8.0000 mL | Freq: Once | INTRAVENOUS | Status: AC | PRN
  Administered 2023-05-19: 8 mL via INTRAVENOUS

## 2023-05-23 ENCOUNTER — Other Ambulatory Visit: Payer: Self-pay | Admitting: Obstetrics and Gynecology

## 2023-05-23 DIAGNOSIS — N6452 Nipple discharge: Secondary | ICD-10-CM

## 2023-05-26 ENCOUNTER — Ambulatory Visit
Admission: RE | Admit: 2023-05-26 | Discharge: 2023-05-26 | Disposition: A | Discharge status: Home / Self Care | Payer: 59 | Source: Ambulatory Visit | Attending: Obstetrics and Gynecology | Admitting: Obstetrics and Gynecology

## 2023-05-26 DIAGNOSIS — N6452 Nipple discharge: Secondary | ICD-10-CM

## 2023-06-21 ENCOUNTER — Other Ambulatory Visit: Payer: 59

## 2023-08-16 ENCOUNTER — Ambulatory Visit: Admitting: Family Medicine

## 2023-08-16 ENCOUNTER — Ambulatory Visit (INDEPENDENT_AMBULATORY_CARE_PROVIDER_SITE_OTHER)

## 2023-08-16 VITALS — BP 138/84 | HR 75 | Ht 67.0 in | Wt 170.0 lb

## 2023-08-16 DIAGNOSIS — G8929 Other chronic pain: Secondary | ICD-10-CM | POA: Diagnosis not present

## 2023-08-16 DIAGNOSIS — M25561 Pain in right knee: Secondary | ICD-10-CM | POA: Diagnosis not present

## 2023-08-16 DIAGNOSIS — M810 Age-related osteoporosis without current pathological fracture: Secondary | ICD-10-CM

## 2023-08-16 LAB — VITAMIN D 25 HYDROXY (VIT D DEFICIENCY, FRACTURES): VITD: 44.18 ng/mL (ref 30.00–100.00)

## 2023-08-16 NOTE — Progress Notes (Unsigned)
   Rubin Payor, PhD, LAT, ATC acting as a scribe for Clementeen Graham, MD.  Anna Mills is a 63 y.o. female who presents to [facility] at [facility] today for osteoporosis management and R knee pain.  DEXA scan (date, T-score): 08/02/23: Spine= -2.6, L-FN= -0.8, R-FN= -1.5 Prior treatment: none History of Hip, Spine, or Wrist Fx: no Heart disease or stroke: no Cancer: no Kidney Disease: no Gastric/Peptic Ulcer: no Gastric bypass surgery: no Severe GERD: no Hx of seizures: no Age at Menopause: 63y/o Calcium intake: no Vitamin D intake: no Hormone replacement therapy: no Smoking history: never smoked Alcohol: none Exercise: yes- brisk walking, aerobics class, home exercises Major dental work in past year: yes- tooth extraction, pending tooth implant Parents with hip/spine fracture: no Height loss: 67.5" to 67"   Pertinent review of systems: ***  Relevant historical information: ***   Exam:  There were no vitals taken for this visit. General: Well Developed, well nourished, and in no acute distress.   MSK: ***    Lab and Radiology Results No results found for this or any previous visit (from the past 72 hours). No results found.     Assessment and Plan: 63 y.o. female with ***   PDMP not reviewed this encounter. No orders of the defined types were placed in this encounter.  No orders of the defined types were placed in this encounter.    Discussed warning signs or symptoms. Please see discharge instructions. Patient expresses understanding.   ***

## 2023-08-16 NOTE — Patient Instructions (Addendum)
 Thank you for coming in today.   Please get an Xray today before you leave   Please get labs today before you leave   Consider Fosamax or Prolia  Let me know if you would like a knee injection in the future.

## 2023-08-17 DIAGNOSIS — G8929 Other chronic pain: Secondary | ICD-10-CM | POA: Insufficient documentation

## 2023-08-17 DIAGNOSIS — M810 Age-related osteoporosis without current pathological fracture: Secondary | ICD-10-CM | POA: Insufficient documentation

## 2023-08-18 ENCOUNTER — Encounter: Payer: Self-pay | Admitting: Family Medicine

## 2023-08-18 NOTE — Progress Notes (Signed)
 Vitamin D level is okay at 44.  Please continue current vitamin D dosing.

## 2023-08-28 NOTE — Progress Notes (Signed)
Right knee x-ray shows a little bit of arthritis.

## 2023-11-14 ENCOUNTER — Encounter: Payer: Self-pay | Admitting: Internal Medicine

## 2023-12-08 ENCOUNTER — Encounter: Payer: Self-pay | Admitting: Internal Medicine

## 2023-12-08 ENCOUNTER — Ambulatory Visit (AMBULATORY_SURGERY_CENTER)

## 2023-12-08 VITALS — Ht 67.0 in | Wt 165.0 lb

## 2023-12-08 DIAGNOSIS — Z860100 Personal history of colon polyps, unspecified: Secondary | ICD-10-CM

## 2023-12-08 MED ORDER — NA SULFATE-K SULFATE-MG SULF 17.5-3.13-1.6 GM/177ML PO SOLN: 1.0000 | Freq: Once | ORAL | 0 refills | Status: AC

## 2023-12-08 NOTE — Progress Notes (Signed)

## 2023-12-28 NOTE — Progress Notes (Unsigned)
 [facility] History and Physical   Primary Care Physician:  Katina Pfeiffer, PA-C   Reason for Procedure:    Encounter Diagnosis  Name Primary?   History of colonic polyps Yes     Plan:    Colonoscopy     HPI: Anna Mills is a 63 y.o. female with a history of a 7 mm sessile serrated polyp removed at colonoscopy 5 years ago.  She presents for routine repeat surveillance exam.  The colonoscopy was otherwise normal.   Past Medical History:  Diagnosis Date   Allergy    Appendicitis    Diverticulosis    Hiatal hernia    Hyperlipidemia    Hypertension    Insomnia    Laryngopharyngeal reflux (LPR)    Plantar fasciitis    Thyroid  disease    Varicose veins     Past Surgical History:  Procedure Laterality Date   COLONOSCOPY     ESOPHAGOGASTRODUODENOSCOPY  2023   LAPAROSCOPIC APPENDECTOMY N/A 12/21/2020   Procedure: APPENDECTOMY LAPAROSCOPIC,LAPAROSCOPIC COLON REPAIR;  Surgeon: Sheldon Standing, MD;  Location: [facility];  Service: General;  Laterality: N/A;   LASER ABLATION Bilateral      Current Outpatient Medications  Medication Sig Dispense Refill   levothyroxine  (SYNTHROID ) 112 MCG tablet Take 112 mcg by mouth daily before breakfast.     lisinopril  (PRINIVIL ,ZESTRIL ) 10 MG tablet Take 5 mg by mouth daily.     Multiple Vitamins-Minerals (MULTIVITAMIN ADULTS 50+ PO) Take 1 tablet by mouth.     pravastatin (PRAVACHOL) 40 MG tablet Take 40 mg by mouth daily.     zolpidem  (AMBIEN ) 10 MG tablet Take 10 mg by mouth at bedtime as needed.     No current facility-administered medications for this visit.    Allergies as of 12/29/2023 - Review Complete 12/08/2023  Allergen Reaction Noted   Codeine Other (See Comments) 10/23/2021   Rosuvastatin Other (See Comments) 08/08/2022   Valcyte [valganciclovir hcl] Swelling 01/13/2015    Family History  Problem Relation Age of Onset   Breast cancer Mother 63   Cancer Mother        breast   Peripheral vascular  disease Father    Breast cancer Daughter 83   Colon cancer Neg Hx    Esophageal cancer Neg Hx    Stomach cancer Neg Hx    Rectal cancer Neg Hx    Colon polyps Neg Hx     Social History   Socioeconomic History   Marital status: Married    Spouse name: Not on file   Number of children: Not on file   Years of education: Not on file   Highest education level: Not on file  Occupational History   Not on file  Tobacco Use   Smoking status: Never   Smokeless tobacco: Never  Vaping Use   Vaping status: Never Used  Substance and Sexual Activity   Alcohol use: No    Alcohol/week: 0.0 standard drinks of alcohol   Drug use: No   Sexual activity: Not on file  Other Topics Concern   Not on file  Social History Narrative   Not on file   Social Drivers of Health   Financial Resource Strain: Not on file  Food Insecurity: Not on file  Transportation Needs: Not on file  Physical Activity: Not on file  Stress: Not on file  Social Connections: Not on file  Intimate Partner Violence: Not on file    Review of Systems: Positive for *** All other review  of systems negative except as mentioned in the HPI.  Physical Exam: Vital signs There were no vitals taken for this visit.  General:   Alert,  Well-developed, well-nourished, pleasant and cooperative in NAD Lungs:  Clear throughout to auscultation.   Heart:  Regular rate and rhythm; no murmurs, clicks, rubs,  or gallops. Abdomen:  Soft, nontender and nondistended. Normal bowel sounds.   Neuro/Psych:  Alert and cooperative. Normal mood and affect. A and O x 3   @Anna Mills  Anna Commander, MD, [facility] [phone] (pager) 12/28/2023 9:06 PM@

## 2023-12-29 ENCOUNTER — Encounter: Payer: Self-pay | Admitting: Internal Medicine

## 2023-12-29 ENCOUNTER — Ambulatory Visit: Admitting: Internal Medicine

## 2023-12-29 VITALS — BP 121/66 | HR 50 | Temp 97.9°F | Resp 11 | Ht 67.0 in | Wt 165.0 lb

## 2023-12-29 DIAGNOSIS — D125 Benign neoplasm of sigmoid colon: Secondary | ICD-10-CM

## 2023-12-29 DIAGNOSIS — K635 Polyp of colon: Secondary | ICD-10-CM | POA: Diagnosis not present

## 2023-12-29 DIAGNOSIS — Z860101 Personal history of adenomatous and serrated colon polyps: Secondary | ICD-10-CM | POA: Diagnosis not present

## 2023-12-29 DIAGNOSIS — K5730 Diverticulosis of large intestine without perforation or abscess without bleeding: Secondary | ICD-10-CM

## 2023-12-29 DIAGNOSIS — Z1211 Encounter for screening for malignant neoplasm of colon: Secondary | ICD-10-CM

## 2023-12-29 DIAGNOSIS — D3610 Benign neoplasm of peripheral nerves and autonomic nervous system, unspecified: Secondary | ICD-10-CM | POA: Diagnosis not present

## 2023-12-29 DIAGNOSIS — Z860100 Personal history of colon polyps, unspecified: Secondary | ICD-10-CM

## 2023-12-29 MED ORDER — SODIUM CHLORIDE 0.9 % IV SOLN: 500.0000 mL | Freq: Once | INTRAVENOUS | Status: DC

## 2023-12-29 NOTE — Progress Notes (Signed)
 Sedate, gd SR, tolerated procedure well, VSS, report to RN

## 2023-12-29 NOTE — Progress Notes (Signed)
 Pt's states no medical or surgical changes since previsit or office visit.

## 2023-12-29 NOTE — Patient Instructions (Addendum)
 One 3 mm polyp removed.  I will let you know pathology results and when to have another routine colonoscopy by mail and/or My Chart.  You also have diverticulosis - common and not usually a problem. Please read the handout provided.   I appreciate the opportunity to care for you. Lupita CHARLENA Commander, MD, [facility]  Discharge instructions given. Handouts on polyps and Diverticulosis. Resume previous medications. YOU HAD AN ENDOSCOPIC PROCEDURE TODAY AT THE [facility]:   Refer to the procedure report that was given to you for any specific questions about what was found during the examination.  If the procedure report does not answer your questions, please call your gastroenterologist to clarify.  If you requested that your care partner not be given the details of your procedure findings, then the procedure report has been included in a sealed envelope for you to review at your convenience later.  YOU SHOULD EXPECT: Some feelings of bloating in the abdomen. Passage of more gas than usual.  Walking can help get rid of the air that was put into your GI tract during the procedure and reduce the bloating. If you had a lower endoscopy (such as a colonoscopy or flexible sigmoidoscopy) you may notice spotting of blood in your stool or on the toilet paper. If you underwent a bowel prep for your procedure, you may not have a normal bowel movement for a few days.  Please Note:  You might notice some irritation and congestion in your nose or some drainage.  This is from the oxygen used during your procedure.  There is no need for concern and it should clear up in a day or so.  SYMPTOMS TO REPORT IMMEDIATELY:  Following lower endoscopy (colonoscopy or flexible sigmoidoscopy):  Excessive amounts of blood in the stool  Significant tenderness or worsening of abdominal pains  Swelling of the abdomen that is new, acute  Fever of 100F or higher   For urgent or emergent issues, a gastroenterologist can  be reached at any hour by calling [phone]. Do not use MyChart messaging for urgent concerns.    DIET:  We do recommend a small meal at first, but then you may proceed to your regular diet.  Drink plenty of fluids but you should avoid alcoholic beverages for 24 hours.  ACTIVITY:  You should plan to take it easy for the rest of today and you should NOT DRIVE or use heavy machinery until tomorrow (because of the sedation medicines used during the test).    FOLLOW UP: Our staff will call the number listed on your records the next business day following your procedure.  We will call around 7:15- 8:00 am to check on you and address any questions or concerns that you may have regarding the information given to you following your procedure. If we do not reach you, we will leave a message.     If any biopsies were taken you will be contacted by phone or by letter within the next 1-3 weeks.  Please call us  at [phone] if you have not heard about the biopsies in 3 weeks.    SIGNATURES/CONFIDENTIALITY: You and/or your care partner have signed paperwork which will be entered into your electronic medical record.  These signatures attest to the fact that that the information above on your After Visit Summary has been reviewed and is understood.  Full responsibility of the confidentiality of this discharge information lies with you and/or your care-partner.

## 2023-12-29 NOTE — Progress Notes (Signed)
 Called to room to assist during endoscopic procedure.  Patient ID and intended procedure confirmed with present staff. Received instructions for my participation in the procedure from the performing physician.

## 2023-12-29 NOTE — Op Note (Signed)
 [facility] Patient Name: Anna Mills Procedure Date: 12/29/2023 1:33 PM MRN: [id number] Endoscopist: Lupita FORBES Commander , MD, [id number] Age: 63 Referring MD:  Date of Birth: Sep 17, 1960 Gender: Female Account #: [id number] Procedure:                Colonoscopy Indications:              High risk colon cancer surveillance: Personal                            history of sessile serrated colon polyp (less than                            10 mm in size) with no dysplasia, Last colonoscopy:                            July 2019 Medicines:                Monitored Anesthesia Care Procedure:                Pre-Anesthesia Assessment:                           - Prior to the procedure, a History and Physical                            was performed, and patient medications and                            allergies were reviewed. The patient's tolerance of                            previous anesthesia was also reviewed. The risks                            and benefits of the procedure and the sedation                            options and risks were discussed with the patient.                            All questions were answered, and informed consent                            was obtained. Prior Anticoagulants: The patient has                            taken no anticoagulant or antiplatelet agents. ASA                            Grade Assessment: II - A patient with mild systemic                            disease. After reviewing the risks and benefits,  the patient was deemed in satisfactory condition to                            undergo the procedure.                           After obtaining informed consent, the colonoscope                            was passed under direct vision. Throughout the                            procedure, the patient's blood pressure, pulse, and                            oxygen saturations were monitored continuously. The                             Olympus Scope [id number] was introduced through the                            anus and advanced to the the cecum, identified by                            appendiceal orifice and ileocecal valve. The                            colonoscopy was performed without difficulty. The                            patient tolerated the procedure well. The quality                            of the bowel preparation was good. The ileocecal                            valve, appendiceal orifice, and rectum were                            photographed. The bowel preparation used was SUPREP                            via split dose instruction. Scope In: 1:41:56 PM Scope Out: 1:58:07 PM Scope Withdrawal Time: 0 hours 11 minutes 52 seconds  Total Procedure Duration: 0 hours 16 minutes 11 seconds  Findings:                 The perianal and digital rectal examinations were                            normal.                           A 3 mm polyp was found in the sigmoid colon. The  polyp was sessile. The polyp was removed with a                            cold snare. Resection and retrieval were complete.                            Verification of patient identification for the                            specimen was done. Estimated blood loss was minimal.                           Multiple medium-mouthed and small-mouthed                            diverticula were found in the sigmoid colon.                           The exam was otherwise without abnormality on                            direct and retroflexion views. Complications:            No immediate complications. Estimated Blood Loss:     Estimated blood loss was minimal. Impression:               - One 3 mm polyp in the sigmoid colon, removed with                            a cold snare. Resected and retrieved.                           - Moderate diverticulosis in the sigmoid colon.                            - The examination was otherwise normal on direct                            and retroflexion views.                           - Personal history of colonic polyp - 7 mm sessile                            serrated poplyp removed 2019. Recommendation:           - Patient has a contact number available for                            emergencies. The signs and symptoms of potential                            delayed complications were discussed with the  patient. Return to normal activities tomorrow.                            Written discharge instructions were provided to the                            patient.                           - Resume previous diet.                           - Continue present medications.                           - Repeat colonoscopy is recommended for                            surveillance. The colonoscopy date will be                            determined after pathology results from today's                            exam become available for review. Lupita FORBES Commander, MD 12/29/2023 2:08:38 PM This report has been signed electronically.

## 2024-01-01 ENCOUNTER — Telehealth: Payer: Self-pay

## 2024-01-01 NOTE — Telephone Encounter (Signed)
  Follow up Call-     12/29/2023    1:15 PM 01/14/2022    9:42 AM  Call back number  Post procedure Call Back phone  # [phone]  Permission to leave phone message Yes Yes     Left message

## 2024-01-03 LAB — SURGICAL PATHOLOGY

## 2024-01-05 ENCOUNTER — Ambulatory Visit: Payer: Self-pay | Admitting: Internal Medicine

## 2024-01-22 IMAGING — DX DG CHEST 2V
2 series · 2 of 2 positions shown · non-contrast
Comparison: None Available.

CLINICAL DATA: Shortness of breath and hairy feeling in throat.

EXAM:
CHEST - 2 VIEW

[chest pa]
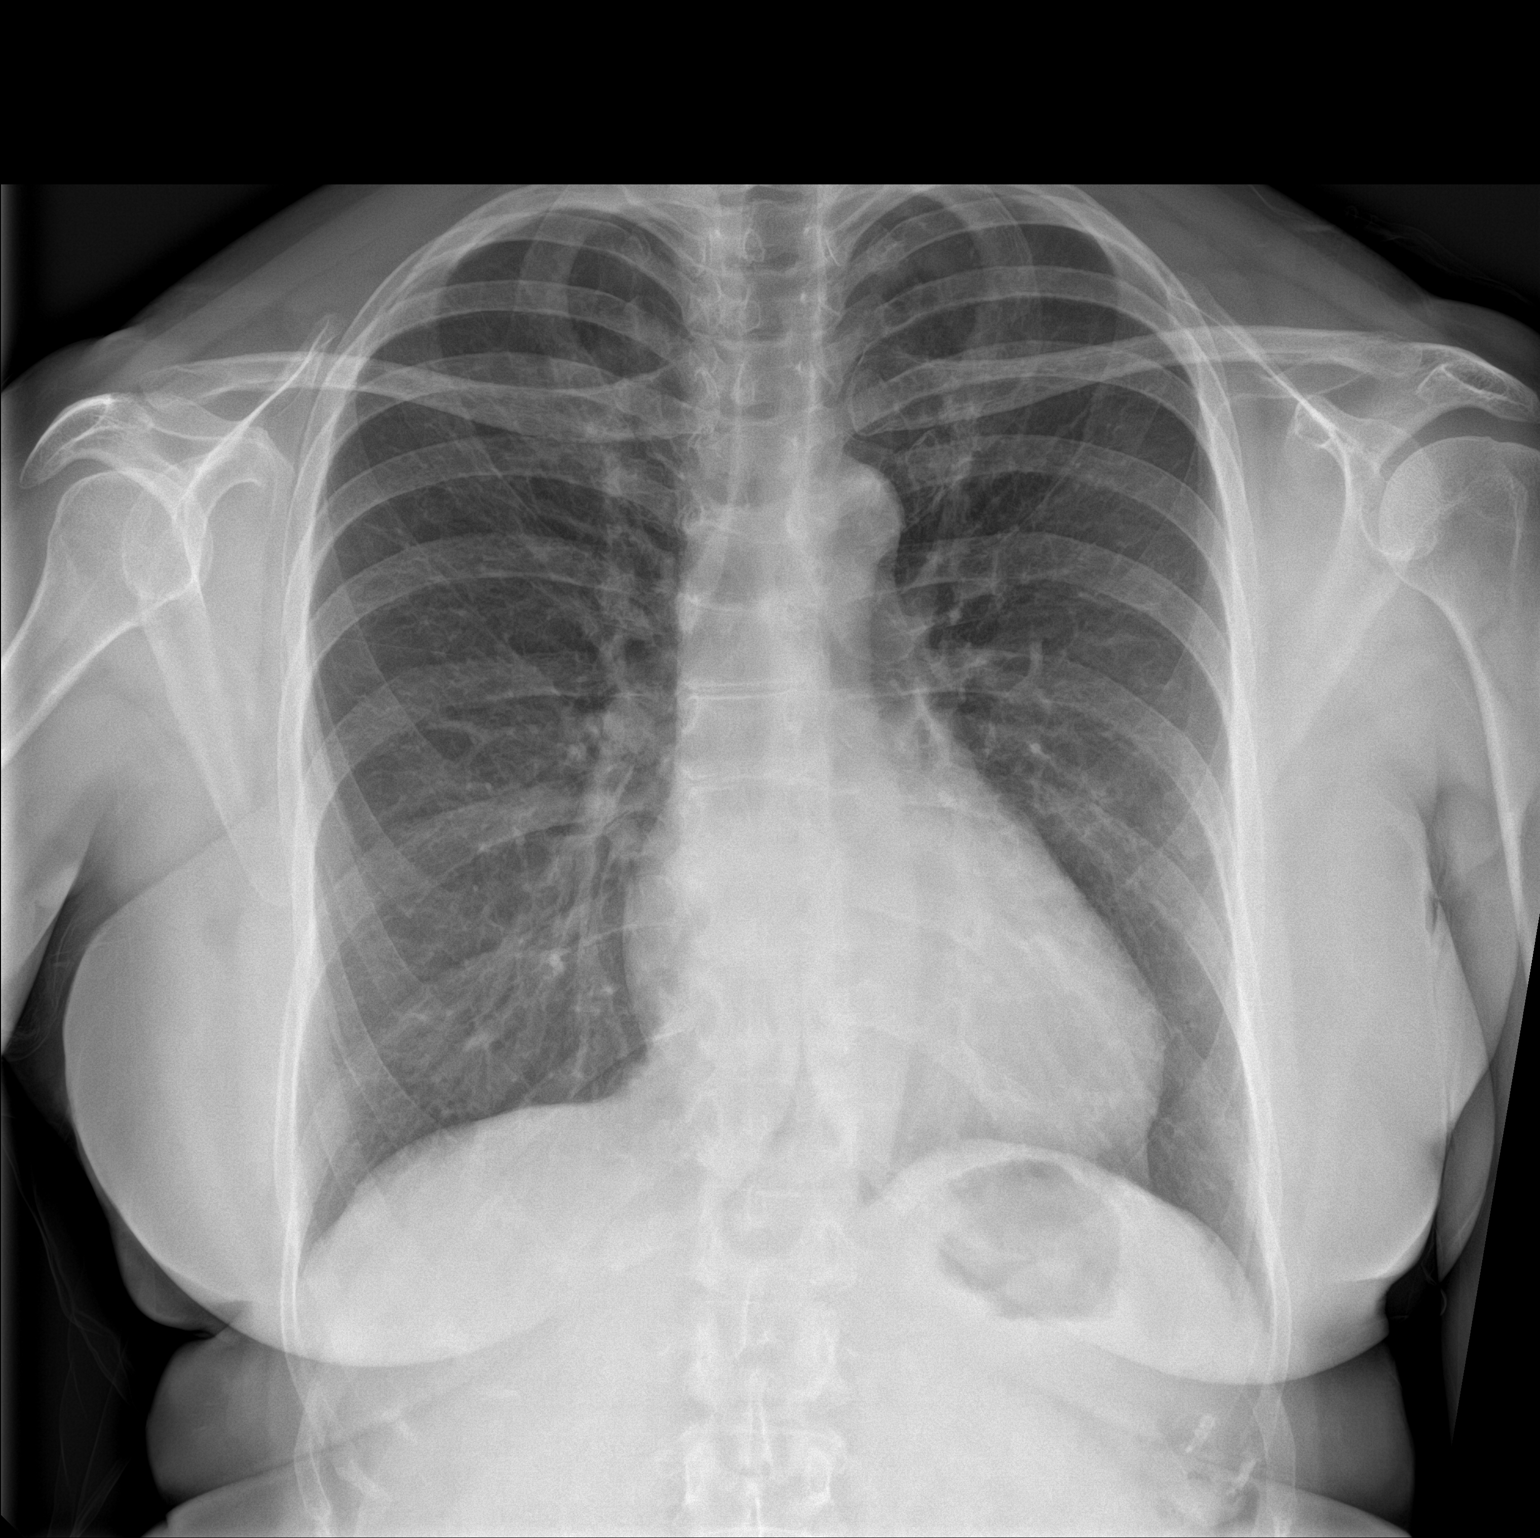

[chest lat]
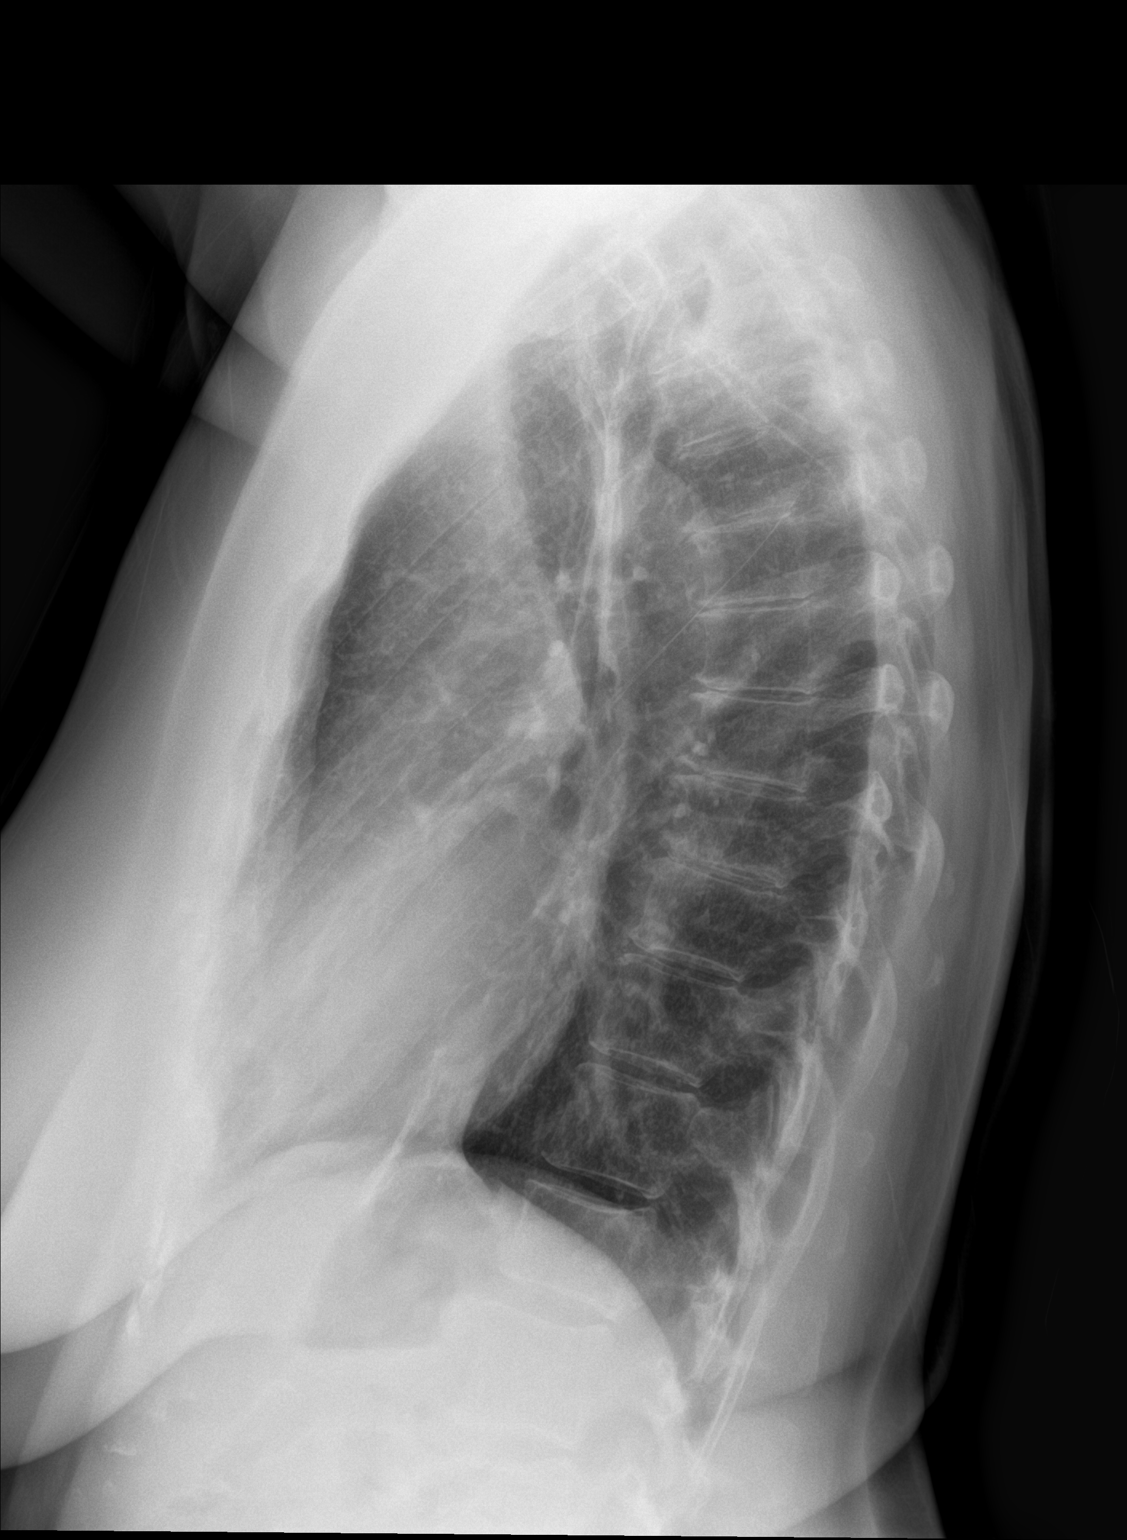

[2 of 2 positions shown; findings below may reference images not displayed]

FINDINGS: Lungs are adequately inflated without focal airspace consolidation
or effusion. Cardiomediastinal silhouette is normal. Bones and soft
tissues are unremarkable.
IMPRESSION: No active cardiopulmonary disease.

## 2024-05-20 ENCOUNTER — Other Ambulatory Visit: Payer: Self-pay | Admitting: Obstetrics and Gynecology

## 2024-05-20 DIAGNOSIS — Z803 Family history of malignant neoplasm of breast: Secondary | ICD-10-CM
